# Patient Record
Sex: Female | Born: 1985 | Race: Black or African American | Hispanic: No | Smoking: Current every day smoker
Health system: Southern US, Community
[De-identification: ages and names within clinical notes are randomized; demographics above are authoritative.]

## PROBLEM LIST (undated history)

## (undated) HISTORY — PX: SKIN GRAFT: SHX250

## (undated) HISTORY — PX: TUBAL LIGATION: SHX77

---

## 2006-01-25 ENCOUNTER — Emergency Department: Payer: Self-pay | Admitting: Emergency Medicine

## 2006-06-25 ENCOUNTER — Inpatient Hospital Stay: Payer: Self-pay | Admitting: Obstetrics and Gynecology

## 2008-03-29 ENCOUNTER — Inpatient Hospital Stay: Payer: Self-pay

## 2008-05-07 ENCOUNTER — Emergency Department: Payer: Self-pay | Admitting: Emergency Medicine

## 2009-09-10 ENCOUNTER — Emergency Department: Payer: Self-pay | Admitting: Unknown Physician Specialty

## 2009-11-08 ENCOUNTER — Emergency Department: Payer: Self-pay | Admitting: Emergency Medicine

## 2010-05-23 ENCOUNTER — Observation Stay: Payer: Self-pay | Admitting: Emergency Medicine

## 2010-06-06 ENCOUNTER — Observation Stay: Payer: Self-pay | Admitting: Obstetrics and Gynecology

## 2010-06-16 ENCOUNTER — Observation Stay: Payer: Self-pay

## 2010-06-23 ENCOUNTER — Observation Stay: Payer: Self-pay | Admitting: Obstetrics and Gynecology

## 2010-06-26 ENCOUNTER — Observation Stay: Payer: Self-pay | Admitting: Obstetrics and Gynecology

## 2010-06-29 ENCOUNTER — Inpatient Hospital Stay: Payer: Self-pay

## 2013-06-23 ENCOUNTER — Observation Stay: Payer: Self-pay | Admitting: Emergency Medicine

## 2013-06-23 LAB — PIH PROFILE
Anion Gap: 6 — ABNORMAL LOW (ref 7–16)
BUN: 5 mg/dL — ABNORMAL LOW (ref 7–18)
Calcium, Total: 8.7 mg/dL (ref 8.5–10.1)
Chloride: 104 mmol/L (ref 98–107)
Co2: 26 mmol/L (ref 21–32)
Creatinine: 0.51 mg/dL — ABNORMAL LOW (ref 0.60–1.30)
EGFR (Non-African Amer.): >60
Glucose: 86 mg/dL (ref 65–99)
HCT: 32.7 % — AB (ref 35.0–47.0)
HGB: 11 g/dL — AB (ref 12.0–16.0)
MCH: 24 pg — AB (ref 26.0–34.0)
MCHC: 33.6 g/dL (ref 32.0–36.0)
MCV: 72 fL — AB (ref 80–100)
OSMOLALITY: 269 (ref 275–301)
PLATELETS: 353 10*3/uL (ref 150–440)
Potassium: 3.8 mmol/L (ref 3.5–5.1)
RBC: 4.57 10*6/uL (ref 3.80–5.20)
RDW: 15.5 % — ABNORMAL HIGH (ref 11.5–14.5)
SGOT(AST): 12 U/L — ABNORMAL LOW (ref 15–37)
SODIUM: 136 mmol/L (ref 136–145)
Uric Acid: 3.5 mg/dL (ref 2.6–6.0)
WBC: 12.9 10*3/uL — ABNORMAL HIGH (ref 3.6–11.0)

## 2013-06-23 LAB — DRUG SCREEN, URINE
Amphetamines, Ur Screen: NEGATIVE (ref ?–1000)
BARBITURATES, UR SCREEN: NEGATIVE (ref ?–200)
BENZODIAZEPINE, UR SCRN: NEGATIVE (ref ?–200)
Cannabinoid 50 Ng, Ur ~~LOC~~: NEGATIVE (ref ?–50)
Cocaine Metabolite,Ur ~~LOC~~: NEGATIVE (ref ?–300)
MDMA (ECSTASY) UR SCREEN: NEGATIVE (ref ?–500)
Methadone, Ur Screen: NEGATIVE (ref ?–300)
OPIATE, UR SCREEN: NEGATIVE (ref ?–300)
Phencyclidine (PCP) Ur S: NEGATIVE (ref ?–25)
Tricyclic, Ur Screen: NEGATIVE (ref ?–1000)

## 2013-06-23 LAB — PROTEIN / CREATININE RATIO, URINE
CREATININE, URINE: 50.6 mg/dL (ref 30.0–125.0)
PROTEIN/CREAT. RATIO: 257 mg/g{creat} — AB (ref 0–200)
Protein, Random Urine: 13 mg/dL — ABNORMAL HIGH (ref 0–12)

## 2013-06-23 LAB — URINALYSIS, COMPLETE
Bilirubin,UR: NEGATIVE
Blood: NEGATIVE
GLUCOSE, UR: NEGATIVE mg/dL (ref 0–75)
Ketone: NEGATIVE
NITRITE: POSITIVE
Ph: 7 (ref 4.5–8.0)
Protein: NEGATIVE
RBC,UR: <1 /HPF (ref 0–5)
Specific Gravity: 1.006 (ref 1.003–1.030)

## 2013-06-23 LAB — FETAL FIBRONECTIN
Appearance: NORMAL
FETAL FIBRONECTIN: NEGATIVE

## 2013-06-26 ENCOUNTER — Observation Stay: Payer: Self-pay

## 2013-07-03 ENCOUNTER — Inpatient Hospital Stay: Payer: Self-pay | Admitting: Obstetrics & Gynecology

## 2013-07-03 LAB — PIH PROFILE
Anion Gap: 7 (ref 7–16)
BUN: 4 mg/dL — AB (ref 7–18)
CALCIUM: 9.3 mg/dL (ref 8.5–10.1)
CO2: 23 mmol/L (ref 21–32)
Chloride: 103 mmol/L (ref 98–107)
Creatinine: 0.52 mg/dL — ABNORMAL LOW (ref 0.60–1.30)
EGFR (African American): >60
EGFR (Non-African Amer.): >60
GLUCOSE: 80 mg/dL (ref 65–99)
HCT: 32.5 % — ABNORMAL LOW (ref 35.0–47.0)
HGB: 10.7 g/dL — ABNORMAL LOW (ref 12.0–16.0)
MCH: 23.9 pg — ABNORMAL LOW (ref 26.0–34.0)
MCHC: 33 g/dL (ref 32.0–36.0)
MCV: 72 fL — ABNORMAL LOW (ref 80–100)
OSMOLALITY: 262 (ref 275–301)
POTASSIUM: 3.6 mmol/L (ref 3.5–5.1)
Platelet: 286 10*3/uL (ref 150–440)
RBC: 4.49 10*6/uL (ref 3.80–5.20)
RDW: 15.5 % — ABNORMAL HIGH (ref 11.5–14.5)
SGOT(AST): 16 U/L (ref 15–37)
SODIUM: 133 mmol/L — AB (ref 136–145)
Uric Acid: 3.4 mg/dL (ref 2.6–6.0)
WBC: 13.6 10*3/uL — AB (ref 3.6–11.0)

## 2013-07-03 LAB — FETAL FIBRONECTIN
APPEARANCE: NORMAL
Fetal Fibronectin: NEGATIVE

## 2013-07-03 LAB — PROTEIN / CREATININE RATIO, URINE
Creatinine, Urine: 36.9 mg/dL (ref 30.0–125.0)
PROTEIN, RANDOM URINE: 6 mg/dL (ref 0–12)
PROTEIN/CREAT. RATIO: 163 mg/g{creat} (ref 0–200)

## 2013-07-05 LAB — BETA STREP CULTURE(ARMC)

## 2013-07-13 ENCOUNTER — Encounter: Payer: Self-pay | Admitting: Obstetrics and Gynecology

## 2013-07-16 ENCOUNTER — Encounter: Payer: Self-pay | Admitting: Obstetrics and Gynecology

## 2013-07-22 ENCOUNTER — Inpatient Hospital Stay: Payer: Self-pay | Admitting: Obstetrics and Gynecology

## 2013-07-22 LAB — CBC WITH DIFFERENTIAL/PLATELET
BASOS ABS: 0 10*3/uL (ref 0.0–0.1)
Basophil %: 0.3 %
Eosinophil #: 0.1 10*3/uL (ref 0.0–0.7)
Eosinophil %: 0.8 %
HCT: 33.3 % — ABNORMAL LOW (ref 35.0–47.0)
HGB: 11.2 g/dL — AB (ref 12.0–16.0)
LYMPHS PCT: 14.3 %
Lymphocyte #: 1.9 10*3/uL (ref 1.0–3.6)
MCH: 24.3 pg — AB (ref 26.0–34.0)
MCHC: 33.5 g/dL (ref 32.0–36.0)
MCV: 73 fL — ABNORMAL LOW (ref 80–100)
Monocyte #: 0.7 x10 3/mm (ref 0.2–0.9)
Monocyte %: 5.1 %
NEUTROS ABS: 10.9 10*3/uL — AB (ref 1.4–6.5)
NEUTROS PCT: 79.5 %
Platelet: 246 10*3/uL (ref 150–440)
RBC: 4.6 10*6/uL (ref 3.80–5.20)
RDW: 16 % — AB (ref 11.5–14.5)
WBC: 13.7 10*3/uL — AB (ref 3.6–11.0)

## 2013-07-23 LAB — HEMATOCRIT: HCT: 30.6 % — ABNORMAL LOW (ref 35.0–47.0)

## 2013-07-25 LAB — PATHOLOGY REPORT

## 2013-08-09 ENCOUNTER — Encounter
Admit: 2013-08-09 | Disposition: A | Discharge status: Home / Self Care | Payer: Self-pay | Admitting: Maternal and Fetal Medicine

## 2014-09-13 ENCOUNTER — Emergency Department
Admit: 2014-09-13 | Disposition: A | Discharge status: Home / Self Care | Payer: Self-pay | Admitting: Emergency Medicine

## 2014-09-13 LAB — BASIC METABOLIC PANEL
Anion Gap: 6 — ABNORMAL LOW (ref 7–16)
BUN: 7 mg/dL
CHLORIDE: 101 mmol/L
CREATININE: 0.63 mg/dL
Calcium, Total: 8.5 mg/dL — ABNORMAL LOW
Co2: 25 mmol/L
EGFR (African American): >60
EGFR (Non-African Amer.): >60
Glucose: 125 mg/dL — ABNORMAL HIGH
Potassium: 3.3 mmol/L — ABNORMAL LOW
Sodium: 132 mmol/L — ABNORMAL LOW

## 2014-09-13 LAB — CBC
HCT: 37.3 % (ref 35.0–47.0)
HGB: 11.7 g/dL — ABNORMAL LOW (ref 12.0–16.0)
MCH: 20.9 pg — ABNORMAL LOW (ref 26.0–34.0)
MCHC: 31.4 g/dL — ABNORMAL LOW (ref 32.0–36.0)
MCV: 67 fL — ABNORMAL LOW (ref 80–100)
Platelet: 369 10*3/uL (ref 150–440)
RBC: 5.59 10*6/uL — ABNORMAL HIGH (ref 3.80–5.20)
RDW: 16.7 % — AB (ref 11.5–14.5)
WBC: 9.1 10*3/uL (ref 3.6–11.0)

## 2014-09-13 LAB — URINALYSIS, COMPLETE
BILIRUBIN, UR: NEGATIVE
GLUCOSE, UR: NEGATIVE mg/dL (ref 0–75)
Ketone: NEGATIVE
NITRITE: NEGATIVE
Ph: 5 (ref 4.5–8.0)
Protein: NEGATIVE
RBC,UR: 20 /HPF (ref 0–5)
Specific Gravity: 1.019 (ref 1.003–1.030)
Squamous Epithelial: 4

## 2014-09-13 LAB — TROPONIN I: Troponin-I: <0.03 ng/mL

## 2014-09-13 LAB — LIPASE, BLOOD: Lipase: 49 U/L

## 2014-10-06 NOTE — Op Note (Signed)
PATIENT NAME:  Chelsey Mendoza, Chelsey Mendoza MR#:  [id number] DATE OF BIRTH:  [birth year]  DATE OF PROCEDURE:  07/22/2013  PREOPERATIVE DIAGNOSES:  1. Dichorionic-diamniotic twin intrauterine pregnancy in the vertex breech presentation at 33 weeks 0 days' gestation.  2. Preterm labor.   POSTOPERATIVE DIAGNOSES:  1. Dichorionic-diamniotic twin intrauterine pregnancy in the vertex breech presentation at 33 weeks 0 days' gestation.  2. Preterm labor.  3. Both fetuses noted to be in the breech presentation.   OPERATION PERFORMED: Primary low transverse cesarean section and bilateral tubal ligation via Pomeroy method.   ANESTHESIA USED: Spinal.   PRIMARY SURGEON: Florina OuAndreas M. Bonney AidStaebler, MD  ASSISTANT: Marta Lamasamara K. Brothers, CNM  DRAINS OR TUBES: Foley to gravity drainage, On-Q catheter system.   IMPLANTS: None.   ESTIMATED BLOOD LOSS: 1 liter.  OPERATIVE FLUIDS: 1800 mL of crystalloid.   URINE OUTPUT: 100 mL of clear urine.   COMPLICATIONS: None.   SPECIMENS REMOVED: Portion of right and left tube.   INTRAOPERATIVE FINDINGS: Normal uterus and ovaries. The left tube was adhesed to the uterine serosa and had to be taken down with the Bovie. The mesosalpinx had to be oversewn bilaterally because of oozing. A single layer closure was performed on the hysterotomy. The delivery resulted in the birth of a liveborn female infant for baby A weighing 1830 grams, Apgars 7 and 8. Baby B was also a female infant weighing 1630 grams, Apgars 8 and 9.   PATIENT CONDITION FOLLOWING PROCEDURE: Stable.   PROCEDURE IN DETAIL: Risks, benefits and alternatives of the procedure were discussed with the patient prior to proceeding to the operating room. Given the fetal weight and presentation, recommendation was made to proceed with primary low transverse C-section for delivery, which the patient was in agreement with. The patient was taken back to the operating room and placed under spinal anesthesia. She was prepped and draped in  the usual sterile fashion and positioned in the supine position. A timeout procedure was performed prior to proceeding with the case. The level of anesthetic was checked, and a Pfannenstiel skin incision was made 2 cm above the pubic symphysis, carried down sharply to the level of the rectus fascia. The fascia was incised in the midline using the knife, and the fascial incision was extended using Mayo scissors. The superior border of the rectus fascia was grasped 2 Kocher clamps, and the underlying rectus muscles were dissected off the fascia using blunt dissection and median raphe incised using Mayo scissors. The inferior border of the rectus fascia was dissected off the rectus muscle in a similar fashion. The midline was identified. The peritoneum was entered bluntly, and the peritoneal incision was extended using manual traction. A bladder blade was placed. A bladder flap was then created using Metzenbaum scissors and further developed digitally before replacing the bladder blade to displace the bladder caudally. A low transverse incision was made on the uterus using the scalpel. The hysterotomy was entered bluntly using the operator's finger. The hysterotomy incision was extended using manual traction. Baby A was noted to be in the frank breech position. The breech was grasped, brought to the incision and delivered to the level of the scapula. The left arm was swept across the infant's chest, splinting the arm. The infant was then rotated 180 degrees. The right arm was delivered in a similar fashion. Mauriceau-Smellie-Veit maneuver was then used to flex the fetal head, and delivery resulted with ease. The infant was suctioned, cord was clamped and cut, and the infant was passed  to the awaiting pediatrician. Baby B was also noted to be in the frank breech position. The breech was grasped, brought to the incision, delivered atraumatically and delivered using the same maneuvers as with baby A. After delivery of the  infants, cord blood was obtained. The placenta was delivered using manual extraction. The uterus was exteriorized, wiped clean of clots and debris using 2 moist laps. Bladder blade was replaced, and the hysterotomy incision was closed using a single layer closure of 0 Vicryl in a running locked fashion. Attention was then turned to the patient's right tube. A segment of tube was doubly ligated using a 0 chromic wheel using a Pomeroy method. The knuckle of tube was then excised using Metzenbaum scissors. Bilateral tubal ostia were visualized. The left tube was noted to be adhesed to the uterine serosa and had to be taken down using a Bovie. The tube was then excised using a similar Pomeroy method as on the right. Upon attempting to replace the uterus in the abdomen, the sutures were noted to have slipped. The mesosalpinx was therefore oversewn using a 2-0 Vicryl in a running locked fashion. The uterus was then returned to the abdomen. The hysterotomy incision was reinspected and noted to be hemostatic, as were both tubes. The On-Q catheter system was placed subfascially per the usual protocol. The fascia was closed using a looped #1 PDS in a running fashion. Subcutaneous tissue was irrigated. Hemostasis was achieved using the Bovie. The skin was closed using Insorb staples. Each On-Q catheter was bolused with 5 mL of 0.5% bupivacaine each. Sponge, needle and instrument counts were correct x2. The patient tolerated the procedure well and was taken to the recovery room in stable condition.    ____________________________ Florina Ou. Bonney Aid, MD ams:lb D: 07/28/2013 11:22:55 ET T: 07/28/2013 11:51:13 ET JOB#: [id number]  cc: Florina Ou. Bonney Aid, MD, <Dictator> [place] Cathrine Muster MD ELECTRONICALLY SIGNED 08/02/2013 1:00
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
## 2014-10-23 NOTE — H&P (Signed)
L&D Evaluation:  History Expanded:  HPI 29 yo G6 P4105 EDD of 09/09/13, [facility]. Presents at 33 wks with c/o cramping that began around 1100 this am, becoming more frequent. Pregnancy notable for Di/Di twins, late onset of care at 25 wks, h/o growth discrepancy with twin B 8% at 28 wks (most recent growth scan on 1/29: twin A 43%, twin B 31% - only 4.3% discordance), previous admission for PTL at 30 wks - received BMZ and Magnesium Sulfate. She desires PP BTL, consent signed 07/10/13.   Blood Type (Maternal) O positive   Group B Strep Results Maternal (Result >5wks must be treated as unknown) negative  from 07/03/13   Maternal HIV Negative   Maternal Syphilis Ab Nonreactive   Maternal Varicella Non-Immune   Rubella Results (Maternal) nonimmune   Presents with contractions   Patient's Medical History No Chronic Illness   Patient's Surgical History skin graft   Medications Pre Natal Vitamins   Allergies NKDA   Social History none   Current Prenatal Course Notable For Multiple Gestation  PreTerm Labor   Exam:  Vital Signs stable   General no apparent distress   Mental Status clear   Chest clear   Heart no murmur/gallop/rubs   Abdomen gravid, tender with contractions   Fetal Position twin A breech per most recent US   Edema no edema   Pelvic 4/50/-3   Mebranes Intact   FHT normal rate with no decels, category 1 tracing for both fetuses   Ucx regular, mild, q 2-10 min   Impression:  Impression twin IUP at 33 wks, PTL   Plan:  Comments IV fluids UA/Urine culture Betamethasone given at previous admission Dr Bonney AidStaebler consulted. Will plan to recheck cervix in approximately 1 hour.   Plan as of 1/29 for C-section and BTL given non-vertex presentation of twin A   Electronic Signatures: Vella KohlerBrothers, Jeaninne Lodico K (CNM)  (Signed 07-Feb-15 14:30)  Authored: L&D Evaluation   Last Updated: 07-Feb-15 14:30 by Vella KohlerBrothers, Kaylianna Detert K (CNM)

## 2014-10-23 NOTE — H&P (Signed)
L&D Evaluation:  History:  HPI 29 yo [id number] with di/di twin pregnancy A: 1167g c/w 21st %ile and B: 106g c/w 8%ile on scan 06/21/13 with mildly elevated [facility]pp[facility] which [facility] the [place] which assigned dating on ths pregnancy with EDC of 09/09/13, 8377w2d today.   Patient presents today for NST. She reports that she has been having contractions for a while but this morning they started getting more uncomfortable. She also reports that she was called this am by the office and was told she has a UTI with a prescription sent in to the pharmacy. Her last visit on L&D was on 1/9 for decreased fetal movement and bleeding. Her FFN was negative at that time, she was having no contractions and her cervix was closed. She was also assessed for pre-eclampsia due to increasedc blood pressures.  Her prenatal labs are O+, RNI, VNI, Hep B neg, HIV neg.   Presents with decreased fetal movement, vaginal bleeding   Patient's Surgical History none   Medications Pre Natal Vitamins   Allergies NKDA   Social History none   Family History Non-Contributory   ROS:  ROS All systems were reviewed.  HEENT, CNS, GI, GU, Respiratory, CV, Renal and Musculoskeletal systems were found to be normal.   Exam:  Vital Signs some elevated blood pressures 130-140's/80's   General no apparent distress   Mental Status clear   Chest clear   Heart normal sinus rhythm   Abdomen gravid, non-tender   Pelvic other, 2/60/-3   Mebranes Intact   FHT normal rate with no decels, appropriate for gestational age   Ucx regular, 2-4 minutes. Has now spaced out to 3-6 minutes   Skin dry, no lesions, no rashes   Lymph no lymphadenopathy   Impression:  Impression reactive NST, IUP of di-di twins at 30.2, R/O PTL, UTI   Plan:  Plan EFM/NST, monitor contractions and for cervical change, monitor BP, PIH panel, FFN sent, possible steroids for fetal lung maturity, will consult with MD   Follow Up Appointment already  scheduled. on Thursday with DPN   Electronic Signatures: Jannet MantisSubudhi, Marykate Heuberger (CNM)  (Signed 19-Jan-15 11:13)  Authored: L&D Evaluation   Last Updated: 19-Jan-15 11:13 by Jannet MantisSubudhi, Jarelly Rinck (CNM)

## 2015-01-31 ENCOUNTER — Emergency Department
Admission: EM | Admit: 2015-01-31 | Discharge: 2015-01-31 | Disposition: A | Discharge status: Home / Self Care | Payer: Medicaid Other | Attending: Emergency Medicine | Admitting: Emergency Medicine

## 2015-01-31 DIAGNOSIS — Y9289 Other specified places as the place of occurrence of the external cause: Secondary | ICD-10-CM | POA: Insufficient documentation

## 2015-01-31 DIAGNOSIS — W57XXXA Bitten or stung by nonvenomous insect and other nonvenomous arthropods, initial encounter: Secondary | ICD-10-CM | POA: Diagnosis not present

## 2015-01-31 DIAGNOSIS — Y9389 Activity, other specified: Secondary | ICD-10-CM | POA: Diagnosis not present

## 2015-01-31 DIAGNOSIS — S80861A Insect bite (nonvenomous), right lower leg, initial encounter: Secondary | ICD-10-CM | POA: Diagnosis not present

## 2015-01-31 DIAGNOSIS — Y998 Other external cause status: Secondary | ICD-10-CM | POA: Insufficient documentation

## 2015-01-31 MED ORDER — BACITRACIN-NEOMYCIN-POLYMYXIN 400-5-5000 EX OINT
TOPICAL_OINTMENT | Freq: Every day | CUTANEOUS | Status: DC
  Administered 2015-01-31: 1 via TOPICAL
  Filled 2015-01-31: qty 1

## 2015-01-31 NOTE — ED Notes (Signed)
Patient with report of insect bite of unknown time frame to right outer thigh, reports waking this morning with increasing pain to that area and some numbness around.

## 2015-01-31 NOTE — Discharge Instructions (Signed)
Insect Bite Mosquitoes, flies, fleas, bedbugs, and many other insects can bite. Insect bites are different from insect stings. A sting is when venom is injected into the skin. Some insect bites can transmit infectious diseases. SYMPTOMS  Insect bites usually turn red, swell, and itch for 2 to 4 days. They often go away on their own. TREATMENT  Your caregiver may prescribe antibiotic medicines if a bacterial infection develops in the bite. HOME CARE INSTRUCTIONS  Do not scratch the bite area.  Keep the bite area clean and dry. Wash the bite area thoroughly with soap and water.  Put ice or cool compresses on the bite area.  Put ice in a plastic bag.  Place a towel between your skin and the bag.  Leave the ice on for 20 minutes, 4 times a day for the first 2 to 3 days, or as directed.  You may apply a baking soda paste, cortisone cream, or calamine lotion to the bite area as directed by your caregiver. This can help reduce itching and swelling.  Only take over-the-counter or prescription medicines as directed by your caregiver.  If you are given antibiotics, take them as directed. Finish them even if you start to feel better. You may need a tetanus shot if:  You cannot remember when you had your last tetanus shot.  You have never had a tetanus shot.  The injury broke your skin. If you get a tetanus shot, your arm may swell, get red, and feel warm to the touch. This is common and not a problem. If you need a tetanus shot and you choose not to have one, there is a rare chance of getting tetanus. Sickness from tetanus can be serious. SEEK IMMEDIATE MEDICAL CARE IF:   You have increased pain, redness, or swelling in the bite area.  You see a red line on the skin coming from the bite.  You have a fever.  You have joint pain.  You have a headache or neck pain.  You have unusual weakness.  You have a rash.  You have chest pain or shortness of breath.  You have abdominal pain,  nausea, or vomiting.  You feel unusually tired or sleepy. MAKE SURE YOU:   Understand these instructions.  Will watch your condition.  Will get help right away if you are not doing well or get worse. Document Released: 07/09/2004 Document Revised: 08/24/2011 Document Reviewed: 12/31/2010 [facility] Patient Information 2015 [place], [place]. This information is not intended to replace advice given to you by your health care provider. Make sure you discuss any questions you have with your health care provider.  Keep the wound clean, dry, and covered.  Apply antibiotic ointment as needed.

## 2015-01-31 NOTE — ED Provider Notes (Signed)
[facility] Emergency Department Provider Note ____________________________________________  Time seen: 1310  I have reviewed the triage vital signs and the nursing notes.  HISTORY  Chief Complaint  Insect Bite  HPI Chelsey Mendoza is a 29 y.o. female reports to the ED for evaluation and management of an insect bite to the right lower leg. She describes recently returned from out-of-town trip, and then when she got out of the shower she noted the area to the right lower leg. She does admit describing the area vigorously while in the shower, thinking that there was something in it. Since that time she's had some increased local redness and tenderness to the area. She denies any fevers, chills, sweats, nausea, vomiting, or dizziness.  No past medical history on file.  There are no active problems to display for this patient.  No past surgical history on file.  No current outpatient prescriptions on file.  Allergies Review of patient's allergies indicates not on file.  No family history on file.  Social History Social History  Substance Use Topics  . Smoking status: Not on file  . Smokeless tobacco: Not on file  . Alcohol Use: Not on file   Review of Systems  Constitutional: Negative for fever. Eyes: Negative for visual changes. ENT: Negative for sore throat. Cardiovascular: Negative for chest pain. Respiratory: Negative for shortness of breath. Gastrointestinal: Negative for abdominal pain, vomiting and diarrhea. Genitourinary: Negative for dysuria. Musculoskeletal: Negative for back pain. Skin: Negative for rash. Insect bite as above Neurological: Negative for headaches, focal weakness or numbness. ____________________________________________  PHYSICAL EXAM:  VITAL SIGNS: ED Triage Vitals  Enc Vitals Group     BP 01/31/15 1206 151/93 mmHg     Pulse Rate 01/31/15 1206 112     Resp 01/31/15 1206 16     Temp 01/31/15 1206 98.2 F (36.8 C)   Temp Source 01/31/15 1206 Oral     SpO2 01/31/15 1206 97 %     Weight 01/31/15 1206 195 lb (88.451 kg)     Height 01/31/15 1206 5\' 6"  (1.676 m)     Head Cir --      Peak Flow --      Pain Score 01/31/15 1207 5     Pain Loc --      Pain Edu? --      Excl. in GC? --    Constitutional: Alert and oriented. Well appearing and in no distress. Eyes: Conjunctivae are normal. PERRL. Normal extraocular movements. ENT   Head: Normocephalic and atraumatic.   Nose: No congestion/rhinnorhea.   Mouth/Throat: Mucous membranes are moist.   Neck: Supple. No thyromegaly. Hematological/Lymphatic/Immunilogical: No cervical lymphadenopathy. Cardiovascular: Normal rate, regular rhythm.  Respiratory: Normal respiratory effort. No wheezes/rales/rhonchi. Gastrointestinal: Soft and nontender. No distention. Musculoskeletal: Nontender with normal range of motion in all extremities.  Neurologic:  Normal gait without ataxia. Normal speech and language. No gross focal neurologic deficits are appreciated. Skin:  Skin is warm, dry and intact. No rash noted. Right lateral calf with a small, abraded area with local erythema. No active drainage noted.   Psychiatric: Mood and affect are normal. Patient exhibits appropriate insight and judgment. ____________________________________________  PROCEDURES  Dressing with antibiotic ointment  ____________________________________________  INITIAL IMPRESSION / ASSESSMENT AND PLAN / ED COURSE  Local insect bite reaction. No indication of cellulitis or abscess formation. Keep the area clean and covered as needed.  ____________________________________________  FINAL CLINICAL IMPRESSION(S) / ED DIAGNOSES  Final diagnoses:  Insect bite  Charlesetta Ivory Baldwin, PA-C 01/31/15 1351  Jene Every, MD 01/31/15 [phone]

## 2015-04-28 ENCOUNTER — Emergency Department
Admission: EM | Admit: 2015-04-28 | Discharge: 2015-04-28 | Disposition: A | Discharge status: Home / Self Care | Payer: Medicaid Other | Attending: Emergency Medicine | Admitting: Emergency Medicine

## 2015-04-28 DIAGNOSIS — R51 Headache: Secondary | ICD-10-CM | POA: Diagnosis present

## 2015-04-28 DIAGNOSIS — J320 Chronic maxillary sinusitis: Secondary | ICD-10-CM

## 2015-04-28 DIAGNOSIS — J0100 Acute maxillary sinusitis, unspecified: Secondary | ICD-10-CM | POA: Insufficient documentation

## 2015-04-28 MED ORDER — PREDNISONE 50 MG PO TABS: ORAL_TABLET | ORAL | Status: DC

## 2015-04-28 MED ORDER — OXYMETAZOLINE HCL 0.05 % NA SOLN
1.0000 | Freq: Once | NASAL | Status: AC
  Administered 2015-04-28: 1 via NASAL
  Filled 2015-04-28 (×2): qty 15

## 2015-04-28 MED ORDER — AMOXICILLIN-POT CLAVULANATE 875-125 MG PO TABS: 1.0000 | ORAL_TABLET | Freq: Two times a day (BID) | ORAL | Status: AC

## 2015-04-28 MED ORDER — OXYMETAZOLINE HCL 0.05 % NA SOLN: 2.0000 | Freq: Two times a day (BID) | NASAL | Status: DC

## 2015-04-28 NOTE — ED Notes (Signed)
Pt informed to return if any life threatening symptoms occur.  

## 2015-04-28 NOTE — ED Notes (Signed)
Pt states intermittent HA X2 weeks that begin in nasal area and moves upward. Pt reports bloody nose last night, thick discharge from nose when blown. Pt comes from work to be evaluated. Pt alert and oriented X4, active, cooperative, pt in NAD. RR even and unlabored, color WNL.

## 2015-04-28 NOTE — Discharge Instructions (Signed)

## 2015-04-28 NOTE — ED Provider Notes (Signed)
[facility] Emergency Department Provider Note     Time seen: ----------------------------------------- 2:42 PM on 04/28/2015 -----------------------------------------    I have reviewed the triage vital signs and the nursing notes.   HISTORY  Chief Complaint Headache    HPI Chelsey Mendoza is a 29 y.o. female who presents ER for intermittent headache for 2 weeks that began the right nasal area and moves upward to her forehead. Patient reports bloody nasal discharge last night with thick discharge from the nose when blown. Patient comes from work to be evaluated, denies any fevers chills but does also complain of tooth pain on the right upper jaw.   History reviewed. No pertinent past medical history.  There are no active problems to display for this patient.   History reviewed. No pertinent past surgical history.  Allergies Review of patient's allergies indicates no known allergies.  Social History Social History  Substance Use Topics  . Smoking status: Never Smoker   . Smokeless tobacco: None  . Alcohol Use: No    Review of Systems Constitutional: Negative for fever. Eyes: Negative for visual changes. ENT: Negative for sore throat. Cardiovascular: Negative for chest pain. Respiratory: Negative for shortness of breath. Gastrointestinal: Negative for abdominal pain, vomiting and diarrhea. Genitourinary: Negative for dysuria. Musculoskeletal: Negative for back pain. Skin: Negative for rash. Neurological: Positive for right frontal headache and facial pain  10-point ROS otherwise negative.  ____________________________________________   PHYSICAL EXAM:  VITAL SIGNS: ED Triage Vitals  Enc Vitals Group     BP 04/28/15 1311 151/89 mmHg     Pulse Rate 04/28/15 1311 104     Resp 04/28/15 1311 18     Temp 04/28/15 1311 98.2 F (36.8 C)     Temp Source 04/28/15 1311 Oral     SpO2 04/28/15 1311 98 %     Weight 04/28/15 1311 191 lb  (86.637 kg)     Height 04/28/15 1311  (1.676 m)     Head Cir --      Peak Flow --      Pain Score 04/28/15 1311 10     Pain Loc --      Pain Edu? --      Excl. in GC? --     Constitutional: Alert and oriented. Well appearing and in no distress. Eyes: Conjunctivae are normal. PERRL. Normal extraocular movements. ENT   Head: Normocephalic and atraumatic. There is tenderness of the right frontal and right maxillary sinus.   Nose: Complete opacification of the right nasal passage with mucosal edema and purulent discharge. There is edema and discharge in the left as well.   Mouth/Throat: Mucous membranes are moist.   Neck: No stridor. Cardiovascular: Normal rate, regular rhythm. Normal and symmetric distal pulses are present in all extremities. No murmurs, rubs, or gallops. Respiratory: Normal respiratory effort without tachypnea nor retractions. Breath sounds are clear and equal bilaterally. No wheezes/rales/rhonchi. Gastrointestinal: Soft and nontender. No distention. No abdominal bruits.  Musculoskeletal: Nontender with normal range of motion in all extremities. No joint effusions.  No lower extremity tenderness nor edema. Neurologic:  Normal speech and language. No gross focal neurologic deficits are appreciated. Speech is normal. No gait instability. Skin:  Skin is warm, dry and intact. No rash noted. Psychiatric: Mood and affect are normal. Speech and behavior are normal. Patient exhibits appropriate insight and judgment. ____________________________________________  ED COURSE:  Pertinent labs & imaging results that were available during my care of the patient were reviewed by me  and considered in my medical decision making (see chart for details). Patient is in no acute distress, clinically has significant sinus infection. Patient states symptoms; on for 2 months on and off and this would likely require  antibiotics. ____________________________________________  FINAL ASSESSMENT AND PLAN  Right maxillary sinusitis  Plan: Patient with labs and imaging as dictated above. Patient was given dose of Afrin here, she'll be discharged with Augmentin, steroids and Afrin nasal spray. She is advised to follow-up with ENT in a week if not improving.   Emily FilbertWilliams, Jonathan E, MD   Emily FilbertJonathan E Williams, MD 04/28/15 [phone]

## 2015-08-31 ENCOUNTER — Emergency Department
Admission: EM | Admit: 2015-08-31 | Discharge: 2015-08-31 | Disposition: A | Discharge status: Home / Self Care | Payer: Medicaid Other | Attending: Emergency Medicine | Admitting: Emergency Medicine

## 2015-08-31 ENCOUNTER — Emergency Department: Payer: Medicaid Other

## 2015-08-31 ENCOUNTER — Encounter: Payer: Self-pay | Admitting: Emergency Medicine

## 2015-08-31 DIAGNOSIS — M25562 Pain in left knee: Secondary | ICD-10-CM | POA: Insufficient documentation

## 2015-08-31 MED ORDER — NAPROXEN 500 MG PO TABS: 500.0000 mg | ORAL_TABLET | Freq: Two times a day (BID) | ORAL | Status: AC

## 2015-08-31 NOTE — Discharge Instructions (Signed)
Advise elastic knee support wire at work.

## 2015-08-31 NOTE — ED Notes (Signed)
Pt c/o left knee pain X 2 months.  Pain worst when bending knee up.  Denies swelling or fevers.  Does not remember injury.

## 2015-08-31 NOTE — ED Provider Notes (Signed)
[facility] Emergency Department Provider Note  ____________________________________________  Time seen: Approximately 3:24 PM  I have reviewed the triage vital signs and the nursing notes.   HISTORY  Chief Complaint Knee Pain    HPI Chelsey Mendoza is a 30 y.o. female patient complaining the left knee pain for 2 months. Patient state pain increase with flexion. Patient denies any injury. Patient denies any problem with the opposite knee. Patient describes the pain as "achy". She stated today she had to lift her leg to get into the car to drive home. Patient rates the pain as a 6/10. No palliative measures taken for this complaint.   History reviewed. No pertinent past medical history.  There are no active problems to display for this patient.   History reviewed. No pertinent past surgical history.  Current Outpatient Rx  Name  Route  Sig  Dispense  Refill  . naproxen (NAPROSYN) 500 MG tablet   Oral   Take 1 tablet (500 mg total) by mouth 2 (two) times daily with a meal.   60 tablet   2   . oxymetazoline (AFRIN) 0.05 % nasal spray   Each Nare   Place 2 sprays into both nostrils 2 (two) times daily.   15 mL   2   . predniSONE (DELTASONE) 50 MG tablet      One tablet by mouth daily   5 tablet   0     Allergies Review of patient's allergies indicates no known allergies.  History reviewed. No pertinent family history.  Social History Social History  Substance Use Topics  . Smoking status: Never Smoker   . Smokeless tobacco: None  . Alcohol Use: No    Review of Systems Constitutional: No fever/chills Eyes: No visual changes. ENT: No sore throat. Cardiovascular: Denies chest pain. Respiratory: Denies shortness of breath. Gastrointestinal: No abdominal pain.  No nausea, no vomiting.  No diarrhea.  No constipation. Genitourinary: Negative for dysuria. Musculoskeletal: Anterior inferior knee pain Skin: Negative for rash. Neurological:  Negative for headaches, focal weakness or numbness.    ____________________________________________   PHYSICAL EXAM:  VITAL SIGNS: ED Triage Vitals  Enc Vitals Group     BP 08/31/15 1446 139/85 mmHg     Pulse --      Resp 08/31/15 1446 18     Temp 08/31/15 1446 89 F (31.7 C)     Temp Source 08/31/15 1446 Oral     SpO2 08/31/15 1446 98 %     Weight 08/31/15 1446 193 lb (87.544 kg)     Height 08/31/15 1446  (1.676 m)     Head Cir --      Peak Flow --      Pain Score 08/31/15 1446 6     Pain Loc --      Pain Edu? --      Excl. in GC? --     Constitutional: Alert and oriented. Well appearing and in no acute distress. Eyes: Conjunctivae are normal. PERRL. EOMI. Head: Atraumatic. Nose: No congestion/rhinnorhea. Mouth/Throat: Mucous membranes are moist.  Oropharynx non-erythematous. Neck: No stridor.  No cervical spine tenderness to palpation. Hematological/Lymphatic/Immunilogical: No cervical lymphadenopathy. Cardiovascular: Normal rate, regular rhythm. Grossly normal heart sounds.  Good peripheral circulation. Respiratory: Normal respiratory effort.  No retractions. Lungs CTAB. Gastrointestinal: Soft and nontender. No distention. No abdominal bruits. No CVA tenderness. Musculoskeletal: No lower extremity tenderness nor edema.  Mild crepitus anterior patella left knee Neurologic:  Normal speech and language. No gross  focal neurologic deficits are appreciated. No gait instability. Skin:  Skin is warm, dry and intact. No rash noted. Psychiatric: Mood and affect are normal. Speech and behavior are normal.  ____________________________________________   LABS (all labs ordered are listed, but only abnormal results are displayed)  Labs Reviewed - No data to display ____________________________________________  EKG   ____________________________________________  RADIOLOGY  No acute findings. I, Joni Reiningonald K Lataja Newland, personally viewed and evaluated these images (plain  radiographs) as part of my medical decision making, as well as reviewing the written report by the radiologist.  ____________________________________________   PROCEDURES  Procedure(s) performed: None  Critical Care performed: No  ____________________________________________   INITIAL IMPRESSION / ASSESSMENT AND PLAN / ED COURSE  Pertinent labs & imaging results that were available during my care of the patient were reviewed by me and considered in my medical decision making (see chart for details).  Arthralgia left knee. Discussed x-ray finding with patient. Patient given discharge Instructions. Patient advised to consider using elastic knee support wire at work. Patient given a prescription for naproxen. Ad[facility]. ____________________________________________   FINAL CLINICAL IMPRESSION(S) / ED DIAGNOSES  Final diagnoses:  Arthralgia of left knee      Joni ReiningRonald K Nalu [place], PA-C 08/31/15 1635  Sharyn CreamerMark Quale, MD 08/31/15 2230

## 2015-12-10 ENCOUNTER — Emergency Department
Admission: EM | Admit: 2015-12-10 | Discharge: 2015-12-10 | Disposition: A | Discharge status: Home / Self Care | Payer: Medicaid Other | Attending: Emergency Medicine | Admitting: Emergency Medicine

## 2015-12-10 ENCOUNTER — Emergency Department: Payer: Medicaid Other

## 2015-12-10 ENCOUNTER — Other Ambulatory Visit: Payer: Self-pay

## 2015-12-10 DIAGNOSIS — Z7952 Long term (current) use of systemic steroids: Secondary | ICD-10-CM | POA: Insufficient documentation

## 2015-12-10 DIAGNOSIS — Z791 Long term (current) use of non-steroidal anti-inflammatories (NSAID): Secondary | ICD-10-CM | POA: Diagnosis not present

## 2015-12-10 DIAGNOSIS — J189 Pneumonia, unspecified organism: Secondary | ICD-10-CM | POA: Insufficient documentation

## 2015-12-10 DIAGNOSIS — R0602 Shortness of breath: Secondary | ICD-10-CM | POA: Diagnosis present

## 2015-12-10 LAB — TROPONIN I

## 2015-12-10 LAB — CBC WITH DIFFERENTIAL/PLATELET
Basophils Absolute: 0.2 10*3/uL — ABNORMAL HIGH (ref 0–0.1)
Basophils Relative: 1 %
EOS ABS: 0.3 10*3/uL (ref 0–0.7)
HCT: 32.8 % — ABNORMAL LOW (ref 35.0–47.0)
Hemoglobin: 10.9 g/dL — ABNORMAL LOW (ref 12.0–16.0)
LYMPHS ABS: 2.9 10*3/uL (ref 1.0–3.6)
Lymphocytes Relative: 18 %
MCH: 21.6 pg — AB (ref 26.0–34.0)
MCHC: 33.4 g/dL (ref 32.0–36.0)
MCV: 64.7 fL — AB (ref 80.0–100.0)
MONO ABS: 1.2 10*3/uL — AB (ref 0.2–0.9)
Neutro Abs: 11.7 10*3/uL — ABNORMAL HIGH (ref 1.4–6.5)
Neutrophils Relative %: 72 %
PLATELETS: 346 10*3/uL (ref 150–440)
RBC: 5.07 MIL/uL (ref 3.80–5.20)
RDW: 17.5 % — AB (ref 11.5–14.5)
WBC: 16.2 10*3/uL — AB (ref 3.6–11.0)

## 2015-12-10 LAB — COMPREHENSIVE METABOLIC PANEL
ALT: 15 U/L (ref 14–54)
AST: 15 U/L (ref 15–41)
Albumin: 4 g/dL (ref 3.5–5.0)
Alkaline Phosphatase: 86 U/L (ref 38–126)
Anion gap: 7 (ref 5–15)
BILIRUBIN TOTAL: 0.4 mg/dL (ref 0.3–1.2)
BUN: 11 mg/dL (ref 6–20)
CHLORIDE: 104 mmol/L (ref 101–111)
CO2: 25 mmol/L (ref 22–32)
Calcium: 8.9 mg/dL (ref 8.9–10.3)
Creatinine, Ser: 0.73 mg/dL (ref 0.44–1.00)
Glucose, Bld: 100 mg/dL — ABNORMAL HIGH (ref 65–99)
POTASSIUM: 3.9 mmol/L (ref 3.5–5.1)
Sodium: 136 mmol/L (ref 135–145)
TOTAL PROTEIN: 7.7 g/dL (ref 6.5–8.1)

## 2015-12-10 MED ORDER — DEXTROSE 5 % IV SOLN
1.0000 g | INTRAVENOUS | Status: DC
  Administered 2015-12-10: 1 g via INTRAVENOUS
  Filled 2015-12-10: qty 10

## 2015-12-10 MED ORDER — HYDROCOD POLST-CPM POLST ER 10-8 MG/5ML PO SUER: 5.0000 mL | Freq: Two times a day (BID) | ORAL | Status: DC

## 2015-12-10 MED ORDER — AMOXICILLIN 500 MG PO CAPS: 500.0000 mg | ORAL_CAPSULE | Freq: Three times a day (TID) | ORAL | Status: DC

## 2015-12-10 MED ORDER — IOPAMIDOL (ISOVUE-370) INJECTION 76%
75.0000 mL | Freq: Once | INTRAVENOUS | Status: AC | PRN
  Administered 2015-12-10: 75 mL via INTRAVENOUS

## 2015-12-10 MED ORDER — AZITHROMYCIN 1 G PO PACK
PACK | ORAL | Status: AC
  Filled 2015-12-10: qty 1

## 2015-12-10 MED ORDER — HYDROCOD POLST-CPM POLST ER 10-8 MG/5ML PO SUER
5.0000 mL | Freq: Once | ORAL | Status: AC
  Administered 2015-12-10: 5 mL via ORAL
  Filled 2015-12-10: qty 5

## 2015-12-10 MED ORDER — AZITHROMYCIN 250 MG PO TABS: 250.0000 mg | ORAL_TABLET | Freq: Every day | ORAL | Status: DC

## 2015-12-10 MED ORDER — AZITHROMYCIN 500 MG IV SOLR
INTRAVENOUS | Status: AC
  Filled 2015-12-10: qty 500

## 2015-12-10 MED ORDER — DEXTROSE 5 % IV SOLN
500.0000 mg | Freq: Once | INTRAVENOUS | Status: AC
  Administered 2015-12-10: 500 mg via INTRAVENOUS
  Filled 2015-12-10: qty 500

## 2015-12-10 NOTE — ED Notes (Signed)
E-sign not working.

## 2015-12-10 NOTE — ED Provider Notes (Signed)
[facility] Emergency Department Provider Note   ____________________________________________  Time seen: Approximately 5:27 AM  I have reviewed the triage vital signs and the nursing notes.   HISTORY  Chief Complaint Shortness of Breath    HPI Chelsey Mendoza is a 30 y.o. female who presents to the ED from home with a chief complaint of mid back pain and cough. Patient reports nonproductive cough for the past 2-3 days. Symptoms not associated with fever, chills, chest pain or shortness of breath. Reports she awoke this morning from sleep with sharp mid thoracic back pain which is worsened with movement and inspiration. Denies recent travel or trauma. Denies OCP use.   Past medical history None  There are no active problems to display for this patient.   No past surgical history on file.  Current Outpatient Rx  Name  Route  Sig  Dispense  Refill  . naproxen (NAPROSYN) 500 MG tablet   Oral   Take 1 tablet (500 mg total) by mouth 2 (two) times daily with a meal.   60 tablet   2   . oxymetazoline (AFRIN) 0.05 % nasal spray   Each Nare   Place 2 sprays into both nostrils 2 (two) times daily.   15 mL   2   . predniSONE (DELTASONE) 50 MG tablet      One tablet by mouth daily   5 tablet   0     Allergies Review of patient's allergies indicates no known allergies.  No family history on file.  Social History Social History  Substance Use Topics  . Smoking status: Never Smoker   . Smokeless tobacco: None  . Alcohol Use: No    Review of Systems  Constitutional: No fever/chills. Eyes: No visual changes. ENT: No sore throat. Cardiovascular: Denies chest pain. Respiratory: Positive for cough and shortness of breath. Gastrointestinal: No abdominal pain.  No nausea, no vomiting.  No diarrhea.  No constipation. Genitourinary: Negative for dysuria. Musculoskeletal: Positive for back pain. Skin: Negative for rash. Neurological: Negative  for headaches, focal weakness or numbness.  10-point ROS otherwise negative.  ____________________________________________   PHYSICAL EXAM:  VITAL SIGNS: ED Triage Vitals  Enc Vitals Group     BP 12/10/15 0241 138/81 mmHg     Pulse Rate 12/10/15 0241 98     Resp 12/10/15 0241 18     Temp 12/10/15 0241 98.2 F (36.8 C)     Temp Source 12/10/15 0241 Oral     SpO2 12/10/15 0241 100 %     Weight 12/10/15 0241 198 lb (89.812 kg)     Height 12/10/15 0241 5\' 6"  (1.676 m)     Head Cir --      Peak Flow --      Pain Score 12/10/15 0242 5     Pain Loc --      Pain Edu? --      Excl. in GC? --     Constitutional: Alert and oriented. Well appearing and in no acute distress. Eyes: Conjunctivae are normal. PERRL. EOMI. Head: Atraumatic. Nose: No congestion/rhinnorhea. Mouth/Throat: Mucous membranes are moist.  Oropharynx non-erythematous. Neck: No stridor.   Cardiovascular: Normal rate, regular rhythm. Grossly normal heart sounds.  Good peripheral circulation. Respiratory: Normal respiratory effort.  No retractions. Lungs CTAB. Gastrointestinal: Soft and nontender. No distention. No abdominal bruits. No CVA tenderness. Musculoskeletal: No thoracic back pain on palpation. No lower extremity tenderness nor edema.  No joint effusions. Neurologic:  Normal speech and  language. No gross focal neurologic deficits are appreciated. No gait instability. Skin:  Skin is warm, dry and intact. No rash noted. Psychiatric: Mood and affect are normal. Speech and behavior are normal.  ____________________________________________   LABS (all labs ordered are listed, but only abnormal results are displayed)  Labs Reviewed  CBC WITH DIFFERENTIAL/PLATELET - Abnormal; Notable for the following:    WBC 16.2 (*)    Hemoglobin 10.9 (*)    HCT 32.8 (*)    MCV 64.7 (*)    MCH 21.6 (*)    RDW 17.5 (*)    Neutro Abs 11.7 (*)    Monocytes Absolute 1.2 (*)    Basophils Absolute 0.2 (*)    All other  components within normal limits  COMPREHENSIVE METABOLIC PANEL - Abnormal; Notable for the following:    Glucose, Bld 100 (*)    All other components within normal limits  CULTURE, BLOOD (ROUTINE X 2)  CULTURE, BLOOD (ROUTINE X 2)  TROPONIN I   ____________________________________________  EKG  ED ECG REPORT I, SUNG,JADE J, the attending physician, personally viewed and interpreted this ECG.   Date: 12/10/2015  EKG Time: 0420  Rate: 87  Rhythm: normal EKG, normal sinus rhythm  Axis: Normal  Intervals:none  ST&T Change: Inverted T waves inferior lateral leads No significant change from November 2009 ____________________________________________  RADIOLOGY  Chest 2 view (view by me, interpreted per Dr. Clovis RileyMitchell): Posterior left lower lobe airspace opacity, possibly early infectious infiltrate.  CT chest interpreted per Dr. Clovis RileyMitchell: 1. Negative for acute pulmonary embolism. 2. Focal consolidation of the left lower lobe posteromedially, likely pneumonia. ____________________________________________   PROCEDURES  Procedure(s) performed: None  Critical Care performed: No  ____________________________________________   INITIAL IMPRESSION / ASSESSMENT AND PLAN / ED COURSE  Pertinent labs & imaging results that were available during my care of the patient were reviewed by me and considered in my medical decision making (see chart for details).  30 year old female who presents with thoracic back pain and cough. Laboratory and imaging results demonstrates mild leukocytosis and possible early infiltrate in the left lower lobe. It is unusual that patient would be awakened from sleep with mid thoracic back pain; will obtain CTA chest to evaluate for PE. Blood cultures and IV antibiotics ordered.  ----------------------------------------- 7:06 AM on 12/10/2015 -----------------------------------------  Updated patient and her mother of CT results. IV antibiotic infusing.  Room air saturations remained at 100%. Plan to discharge home after completion of the antibiotics. She will be discharged with prescriptions for amoxicillin, azithromycin and Tussionex. Strict return precautions given. Both verbalize understanding and agree with plan of care. ____________________________________________   FINAL CLINICAL IMPRESSION(S) / ED DIAGNOSES  Final diagnoses:  CAP (community acquired pneumonia)      NEW MEDICATIONS STARTED DURING THIS VISIT:  New Prescriptions   No medications on file     Note:  This document was prepared using Dragon voice recognition software and may include unintentional dictation errors.    Irean HongJade J Sung, MD 12/10/15 [phone]

## 2015-12-10 NOTE — Discharge Instructions (Signed)
1. Take antibiotics as prescribed: Amoxicillin 500 mg 3 times daily 7 days Azithromycin 250 mg daily 4 days 2. You may take cough medicine as needed (Tussionex). 3. Return to the ER for worsening symptoms, persistent vomiting, fever, difficult breathing or other concerns.  Community-Acquired Pneumonia, Adult Pneumonia is an infection of the lungs. There are different types of pneumonia. One type can develop while a person is in a hospital. A different type, called community-acquired pneumonia, develops in people who are not, or have not recently been, in the hospital or other health care facility.  CAUSES Pneumonia may be caused by bacteria, viruses, or funguses. Community-acquired pneumonia is often caused by Streptococcus pneumonia bacteria. These bacteria are often passed from one person to another by breathing in droplets from the cough or sneeze of an infected person. RISK FACTORS The condition is more likely to develop in:  People who havechronic diseases, such as chronic obstructive pulmonary disease (COPD), asthma, congestive heart failure, cystic fibrosis, diabetes, or kidney disease.  People who haveearly-stage or late-stage HIV.  People who havesickle cell disease.  People who havehad their spleen removed (splenectomy).  People who havepoor Administratordental hygiene.  People who havemedical conditions that increase the risk of breathing in (aspirating) secretions their own mouth and nose.   People who havea weakened immune system (immunocompromised).  People who smoke.  People whotravel to areas where pneumonia-causing germs commonly exist.  People whoare around animal habitats or animals that have pneumonia-causing germs, including birds, bats, rabbits, cats, and farm animals. SYMPTOMS Symptoms of this condition include:  Adry cough.  A wet (productive) cough.  Fever.  Sweating.  Chest pain, especially when breathing deeply or coughing.  Rapid breathing or  difficulty breathing.  Shortness of breath.  Shaking chills.  Fatigue.  Muscle aches. DIAGNOSIS Your health care provider will take a medical history and perform a physical exam. You may also have other tests, including:  Imaging studies of your chest, including X-rays.  Tests to check your blood oxygen level and other blood gases.  Other tests on blood, mucus (sputum), fluid around your lungs (pleural fluid), and urine. If your pneumonia is severe, other tests may be done to identify the specific cause of your illness. TREATMENT The type of treatment that you receive depends on many factors, such as the cause of your pneumonia, the medicines you take, and other medical conditions that you have. For most adults, treatment and recovery from pneumonia may occur at home. In some cases, treatment must happen in a hospital. Treatment may include:  Antibiotic medicines, if the pneumonia was caused by bacteria.  Antiviral medicines, if the pneumonia was caused by a virus.  Medicines that are given by mouth or through an IV tube.  Oxygen.  Respiratory therapy. Although rare, treating severe pneumonia may include:  Mechanical ventilation. This is done if you are not breathing well on your own and you cannot maintain a safe blood oxygen level.  Thoracentesis. This procedureremoves fluid around one lung or both lungs to help you breathe better. HOME CARE INSTRUCTIONS  Take over-the-counter and prescription medicines only as told by your health care provider.  Only takecough medicine if you are losing sleep. Understand that cough medicine can prevent your body's natural ability to remove mucus from your lungs.  If you were prescribed an antibiotic medicine, take it as told by your health care provider. Do not stop taking the antibiotic even if you start to feel better.  Sleep in a semi-upright position at  night. Try sleeping in a reclining chair, or place a few pillows under your  head.  Do not use tobacco products, including cigarettes, chewing tobacco, and e-cigarettes. If you need help quitting, ask your health care provider.  Drink enough water to keep your urine clear or pale yellow. This will help to thin out mucus secretions in your lungs. PREVENTION There are ways that you can decrease your risk of developing community-acquired pneumonia. Consider getting a pneumococcal vaccine if:  You are older than 32 years of age.  You are older than 32 years of age and are undergoing cancer treatment, have chronic lung disease, or have other medical conditions that affect your immune system. Ask your health care provider if this applies to you. There are different types and schedules of pneumococcal vaccines. Ask your health care provider which vaccination option is best for you. You may also prevent community-acquired pneumonia if you take these actions:  Get an influenza vaccine every year. Ask your health care provider which type of influenza vaccine is best for you.  Go to the dentist on a regular basis.  Wash your hands often. Use hand sanitizer if soap and water are not available. SEEK MEDICAL CARE IF:  You have a fever.  You are losing sleep because you cannot control your cough with cough medicine. SEEK IMMEDIATE MEDICAL CARE IF:  You have worsening shortness of breath.  You have increased chest pain.  Your sickness becomes worse, especially if you are an older adult or have a weakened immune system.  You cough up blood.   This information is not intended to replace advice given to you by your health care provider. Make sure you discuss any questions you have with your health care provider.   Document Released: 06/01/2005 Document Revised: 02/20/2015 Document Reviewed: 09/26/2014 Elsevier Interactive Patient Education [employer]2016 [employer]

## 2015-12-10 NOTE — ED Notes (Signed)
Blood draw #2: RIGHT AC

## 2015-12-10 NOTE — ED Notes (Signed)
Blood culture draw #1: LEFT AC 

## 2015-12-10 NOTE — ED Notes (Signed)
Pt woke up this am with co mid back pain that is worse when she moves or takes a deep breath. States makes her feels shob, no injury noted. Has had a cough recently.

## 2015-12-15 LAB — CULTURE, BLOOD (ROUTINE X 2)
Culture: NO GROWTH
Culture: NO GROWTH

## 2016-04-22 ENCOUNTER — Emergency Department
Admission: EM | Admit: 2016-04-22 | Discharge: 2016-04-22 | Disposition: A | Discharge status: Home / Self Care | Payer: Medicaid Other | Attending: Emergency Medicine | Admitting: Emergency Medicine

## 2016-04-22 ENCOUNTER — Encounter: Payer: Self-pay | Admitting: Emergency Medicine

## 2016-04-22 ENCOUNTER — Emergency Department: Payer: Medicaid Other

## 2016-04-22 DIAGNOSIS — Z79899 Other long term (current) drug therapy: Secondary | ICD-10-CM | POA: Diagnosis not present

## 2016-04-22 DIAGNOSIS — R51 Headache: Secondary | ICD-10-CM | POA: Diagnosis not present

## 2016-04-22 DIAGNOSIS — Z87891 Personal history of nicotine dependence: Secondary | ICD-10-CM | POA: Insufficient documentation

## 2016-04-22 DIAGNOSIS — R220 Localized swelling, mass and lump, head: Secondary | ICD-10-CM | POA: Diagnosis not present

## 2016-04-22 DIAGNOSIS — H538 Other visual disturbances: Secondary | ICD-10-CM

## 2016-04-22 DIAGNOSIS — Z791 Long term (current) use of non-steroidal anti-inflammatories (NSAID): Secondary | ICD-10-CM | POA: Diagnosis not present

## 2016-04-22 LAB — BASIC METABOLIC PANEL
Anion gap: 7 (ref 5–15)
BUN: 11 mg/dL (ref 6–20)
CHLORIDE: 105 mmol/L (ref 101–111)
CO2: 26 mmol/L (ref 22–32)
CREATININE: 0.59 mg/dL (ref 0.44–1.00)
Calcium: 9.1 mg/dL (ref 8.9–10.3)
GFR calc Af Amer: >60 mL/min (ref >60)
GFR calc non Af Amer: >60 mL/min (ref >60)
Glucose, Bld: 96 mg/dL (ref 65–99)
POTASSIUM: 3.6 mmol/L (ref 3.5–5.1)
SODIUM: 138 mmol/L (ref 135–145)

## 2016-04-22 LAB — CBC
HEMATOCRIT: 31.1 % — AB (ref 35.0–47.0)
HEMOGLOBIN: 10.3 g/dL — AB (ref 12.0–16.0)
MCH: 21.1 pg — AB (ref 26.0–34.0)
MCHC: 33.2 g/dL (ref 32.0–36.0)
MCV: 63.5 fL — ABNORMAL LOW (ref 80.0–100.0)
Platelets: 355 10*3/uL (ref 150–440)
RBC: 4.89 MIL/uL (ref 3.80–5.20)
RDW: 17.7 % — ABNORMAL HIGH (ref 11.5–14.5)
WBC: 13.1 10*3/uL — ABNORMAL HIGH (ref 3.6–11.0)

## 2016-04-22 NOTE — ED Triage Notes (Signed)
Pt comes into the ED via POV c/o headaches, blurred vision, and new masses that have presented on the posterior side of her head.  Masses are palpable and tender to the touch.  Patient states the blurred vision started around 17:00 today.  She explains that she can "focus it and look at one thing", but the rest of the room looks blurry. The headache started at the same time the masses appears which was 3 days ago.  Patient in NAD at this time and is Negative on NIH scale.

## 2016-04-22 NOTE — ED Provider Notes (Addendum)
[facility] Emergency Department Provider Note   ____________________________________________    I have reviewed the triage vital signs and the nursing notes.   HISTORY  Chief Complaint Blurry vision, lumps on head    HPI Chelsey Mendoza is a 30 y.o. female who presents with complaints of "bumps on the back of her head" which developed 2-3 days ago. She reports they are mildly painful but denies discharge. No fevers or chills. She has never had these before. She has also had an intermittent headache over the last 2 days which she describes as global and throbbing. Today she feels that her vision has been blurry all day and this is unusual for her. She is concerned this is related to the bumps on the back of her head. No neuro deficits.she reports both eyes are affected equally. Denies a history of diabetes   History reviewed. No pertinent past medical history.  There are no active problems to display for this patient.   Past Surgical History:  Procedure Laterality Date  . SKIN GRAFT      Prior to Admission medications   Medication Sig Start Date End Date Taking? Authorizing Provider  amoxicillin (AMOXIL) 500 MG capsule Take 1 capsule (500 mg total) by mouth 3 (three) times daily. 12/10/15   Irean HongJade J Sung, MD  azithromycin (ZITHROMAX) 250 MG tablet Take 1 tablet (250 mg total) by mouth daily. 12/10/15   Irean HongJade J Sung, MD  chlorpheniramine-HYDROcodone (TUSSIONEX PENNKINETIC ER) 10-8 MG/5ML SUER Take 5 mLs by mouth 2 (two) times daily. 12/10/15   Irean HongJade J Sung, MD  naproxen (NAPROSYN) 500 MG tablet Take 1 tablet (500 mg total) by mouth 2 (two) times daily with a meal. 08/31/15 08/30/16  Joni Reiningonald K Smith, PA-C  oxymetazoline (AFRIN) 0.05 % nasal spray Place 2 sprays into both nostrils 2 (two) times daily. 04/28/15   Emily FilbertJonathan E Williams, MD  predniSONE (DELTASONE) 50 MG tablet One tablet by mouth daily 04/28/15   Emily FilbertJonathan E Williams, MD     Allergies Patient has no  known allergies.  No family history on file.  Social History Social History  Substance Use Topics  . Smoking status: Former Games developermoker  . Smokeless tobacco: Never Used  . Alcohol use No    Review of Systems  Constitutional: No fever/chills Eyes: as above  Cardiovascular: Denies chest pain. Respiratory: Denies cough Gastrointestinal:No nausea, no vomiting.   Genitourinary: Negative for dysuria. Musculoskeletal: Negative for neck pain Skin: Negative for rash. Neurological: Negative for focal weakness  10-point ROS otherwise negative.  ____________________________________________   PHYSICAL EXAM:  VITAL SIGNS: ED Triage Vitals [04/22/16 2116]  Enc Vitals Group     BP (!) 156/106     Pulse Rate 95     Resp 16     Temp 98.1 F (36.7 C)     Temp Source Oral     SpO2 98 %     Weight 220 lb (99.8 kg)     Height 5\' 6"  (1.676 m)     Head Circumference      Peak Flow      Pain Score 4     Pain Loc      Pain Edu?      Excl. in GC?     Constitutional: Alert and oriented. No acute distress. Pleasant and interactive Eyes: Conjunctivae are normal. Normal funduscopic exam, PERRLA, No hyphema. No pain with EOM. No light sensitivity. No steamy cornea Head: Atraumatic.2 1 x 1 cm areas of  induration on the posterior right inferior scalp, no fluctuance or discharge. No erythema. Suspect mild folliculitis. Nose: No congestion/rhinnorhea. Mouth/Throat: Mucous membranes are moist.   Neck:  Painless ROM Cardiovascular: Normal rate, regular rhythm.   Good peripheral circulation. Respiratory: Normal respiratory effort.  No retractions.    Musculoskeletal: No lower extremity tenderness nor edema.  Warm and well perfused Neurologic:  Normal speech and language. No gross focal neurologic deficits are appreciated.  Skin:  Skin is warm, dry and intact. No rash noted. Psychiatric: Mood and affect are normal. Speech and behavior are normal.  ____________________________________________     LABS (all labs ordered are listed, but only abnormal results are displayed)  Labs Reviewed  CBC - Abnormal; Notable for the following:       Result Value   WBC 13.1 (*)    Hemoglobin 10.3 (*)    HCT 31.1 (*)    MCV 63.5 (*)    MCH 21.1 (*)    RDW 17.7 (*)    All other components within normal limits  BASIC METABOLIC PANEL   ____________________________________________  EKG  None ____________________________________________  RADIOLOGY  CT head pending ____________________________________________   PROCEDURES  Procedure(s) performed: No    Critical Care performed: No ____________________________________________   INITIAL IMPRESSION / ASSESSMENT AND PLAN / ED COURSE  Pertinent labs & imaging results that were available during my care of the patient were reviewed by me and considered in my medical decision making (see chart for details).  Patient well-appearing and in no distress.we will check visual acuity, basic lab work to evaluate for possible diabetes and CT head.  Workup is benign. No acute abnormalities noted, not consistent with glaucoma, nor with CRAO or CRVO,  Clinical Course   lab work is unremarkable ____________________________________________ CT head is unremarkable  We'll refer for close follow-up with ophthalmology  FINAL CLINICAL IMPRESSION(S) / ED DIAGNOSES  Blurry vision   NEW MEDICATIONS STARTED DURING THIS VISIT:  New Prescriptions   No medications on file     Note:  This document was prepared using Dragon voice recognition software and may include unintentional dictation errors.    Jene Everyobert Seira Cody, MD 04/22/16 [id number]    Jene Everyobert Keiri Solano, MD 04/22/16 [phone]

## 2016-07-01 ENCOUNTER — Encounter: Payer: Self-pay | Admitting: Emergency Medicine

## 2016-07-01 ENCOUNTER — Emergency Department
Admission: EM | Admit: 2016-07-01 | Discharge: 2016-07-01 | Disposition: A | Discharge status: Home / Self Care | Payer: Self-pay | Attending: Student in an Organized Health Care Education/Training Program | Admitting: Student in an Organized Health Care Education/Training Program

## 2016-07-01 ENCOUNTER — Emergency Department: Payer: Self-pay

## 2016-07-01 DIAGNOSIS — Z79899 Other long term (current) drug therapy: Secondary | ICD-10-CM | POA: Insufficient documentation

## 2016-07-01 DIAGNOSIS — R519 Headache, unspecified: Secondary | ICD-10-CM

## 2016-07-01 DIAGNOSIS — Z87891 Personal history of nicotine dependence: Secondary | ICD-10-CM | POA: Insufficient documentation

## 2016-07-01 DIAGNOSIS — R51 Headache: Secondary | ICD-10-CM | POA: Insufficient documentation

## 2016-07-01 MED ORDER — BUTALBITAL-APAP-CAFFEINE 50-325-40 MG PO TABS: 1.0000 | ORAL_TABLET | Freq: Four times a day (QID) | ORAL | 0 refills | Status: AC | PRN

## 2016-07-01 MED ORDER — CARBAMAZEPINE 200 MG PO TABS
200.0000 mg | ORAL_TABLET | Freq: Once | ORAL | Status: AC
  Administered 2016-07-01: 200 mg via ORAL
  Filled 2016-07-01: qty 1

## 2016-07-01 MED ORDER — ACETAMINOPHEN 500 MG PO TABS
1000.0000 mg | ORAL_TABLET | Freq: Once | ORAL | Status: AC
  Administered 2016-07-01: 1000 mg via ORAL
  Filled 2016-07-01: qty 2

## 2016-07-01 MED ORDER — CARBAMAZEPINE ER 100 MG PO TB12: 100.0000 mg | ORAL_TABLET | Freq: Two times a day (BID) | ORAL | 0 refills | Status: DC

## 2016-07-01 MED ORDER — PROCHLORPERAZINE EDISYLATE 5 MG/ML IJ SOLN
10.0000 mg | Freq: Once | INTRAMUSCULAR | Status: AC
  Administered 2016-07-01: 10 mg via INTRAMUSCULAR
  Filled 2016-07-01: qty 2

## 2016-07-01 MED ORDER — DIPHENHYDRAMINE HCL 50 MG/ML IJ SOLN
25.0000 mg | Freq: Once | INTRAMUSCULAR | Status: AC
Start: 2016-07-01 — End: 2016-07-01
  Administered 2016-07-01: 25 mg via INTRAMUSCULAR
  Filled 2016-07-01: qty 1

## 2016-07-01 NOTE — ED Notes (Signed)
E-signature pad malfunctioned. Patient indicated understanding of discharge instructions by making appropriate comments.

## 2016-07-01 NOTE — Discharge Instructions (Signed)

## 2016-07-01 NOTE — ED Provider Notes (Signed)
[facility] Emergency Department Provider Note    None    (approximate)  I have reviewed the triage vital signs and the nursing notes.   HISTORY  Chief Complaint Facial Pain    HPI Chelsey Mendoza is a 31 y.o. female presents with several months of intermittent paroxysmal 10 out of 10 Lancinating right facial pain that occurs in spurts.She denies any fevers. States that she is been dealing with one episode became with abrupt onset last night and has kept her awake. Visit the pain is radiating from the front of her right ear all the way to the right medial nose and forehead. States that she'll adamantly have flashing white lights to the pain. No blurry vision at this time. No trauma. No recent antibiotics. No nausea or vomiting. Denies any numbness or tingling. States the symptoms will just spontaneously resolved. Has never seen a neurologist for this before.   History reviewed. No pertinent past medical history. FMH:  No bleeding disorders Past Surgical History:  Procedure Laterality Date  . SKIN GRAFT     There are no active problems to display for this patient.     Prior to Admission medications   Medication Sig Start Date End Date Taking? Authorizing Provider  amoxicillin (AMOXIL) 500 MG capsule Take 1 capsule (500 mg total) by mouth 3 (three) times daily. 12/10/15   Irean HongJade J Sung, MD  azithromycin (ZITHROMAX) 250 MG tablet Take 1 tablet (250 mg total) by mouth daily. 12/10/15   Irean HongJade J Sung, MD  chlorpheniramine-HYDROcodone (TUSSIONEX PENNKINETIC ER) 10-8 MG/5ML SUER Take 5 mLs by mouth 2 (two) times daily. 12/10/15   Irean HongJade J Sung, MD  naproxen (NAPROSYN) 500 MG tablet Take 1 tablet (500 mg total) by mouth 2 (two) times daily with a meal. 08/31/15 08/30/16  Joni Reiningonald K Smith, PA-C  oxymetazoline (AFRIN) 0.05 % nasal spray Place 2 sprays into both nostrils 2 (two) times daily. 04/28/15   Emily FilbertJonathan E Williams, MD  predniSONE (DELTASONE) 50 MG tablet One tablet by  mouth daily 04/28/15   Emily FilbertJonathan E Williams, MD    Allergies Patient has no known allergies.    Social History Social History  Substance Use Topics  . Smoking status: Former Games developermoker  . Smokeless tobacco: Never Used  . Alcohol use No    Review of Systems Patient denies headaches, rhinorrhea, blurry vision, numbness, shortness of breath, chest pain, edema, cough, abdominal pain, nausea, vomiting, diarrhea, dysuria, fevers, rashes or hallucinations unless otherwise stated above in HPI. ____________________________________________   PHYSICAL EXAM:  VITAL SIGNS: Vitals:   07/01/16 1202  BP: (!) 169/100  Pulse: 96  Resp: 20  Temp: 98.2 F (36.8 C)    Constitutional: Alert and oriented. Well appearing and in no acute distress. Eyes: Conjunctivae are normal. PERRL. EOMI. Head: Atraumatic. Nose: No congestion/rhinnorhea. Mouth/Throat: Mucous membranes are moist.  Oropharynx non-erythematous. Neck: No stridor. Painless ROM. No cervical spine tenderness to palpation Hematological/Lymphatic/Immunilogical: No cervical lymphadenopathy. Cardiovascular: Normal rate, regular rhythm. Grossly normal heart sounds.  Good peripheral circulation. Respiratory: Normal respiratory effort.  No retractions. Lungs CTAB. Gastrointestinal: Soft and nontender. No distention. No abdominal bruits. No CVA tenderness. Musculoskeletal: No lower extremity tenderness nor edema.  No joint effusions. Neurologic:  CNI, no temporal artery ttp.  Normal speech and language. No gross focal neurologic deficits are appreciated. No gait instability. Skin:  Skin is warm, dry and intact. No rash noted. Psychiatric: Mood and affect are normal. Speech and behavior are normal. __________________________________________  LABS (all labs ordered are listed, but only abnormal results are displayed)  No results found for this or any previous visit (from the past 24  hour(s)). ____________________________________________  EKG____________________________________________  RADIOLOGY  I personally reviewed all radiographic images ordered to evaluate for the above acute complaints and reviewed radiology reports and findings.  These findings were personally discussed with the patient.  Please see medical record for radiology report.  ____________________________________________   PROCEDURES  Procedure(s) performed:  Procedures    Critical Care performed: no ____________________________________________   INITIAL IMPRESSION / ASSESSMENT AND PLAN / ED COURSE  Pertinent labs & imaging results that were available during my care of the patient were reviewed by me and considered in my medical decision making (see chart for details).  DDX: Tn, GCA, migraine, cluster, sinusitis, neuralgia  Chelsey Mendoza is a 31 y.o. who presents to the ED with with Hx of migraine p/w HA for last several months as described above.  Denies focal neurologic symptoms. Denies trauma. No fevers or neck pain. Afebrile in ED. VSS. Exam as above. No meningeal signs. No CN, motor, sensory or cerebellar deficits. Temporal arteries palpable and non-tender. Appears well and non-toxic.   Will order ct to eval for sinusitis or mass.  Based on distribution of pain and timing, I do believe this sounds consistent with trigeminal neuralgia.  possible tension, non-specific or possible migraine HA. Clinical picture is not consistent with ICH, SAH, SDH, EDH, TIA, or CVA. No concern for meningitis or encephalitis. No concern for GCA/Temporal arteritis.  CT head with NAICA.  Gave carbamazepine, Pain improved. Repeat neuro exam is again without focal deficit, nuchal rigidity or evidence of meningeal irritation.  Stable to D/C home, follow up with PCP or Neurology if persistent recurrent Has.  Have discussed with the patient and available family all diagnostics and treatments performed thus far and all  questions were answered to the best of my ability. The patient demonstrates understanding and agreement with plan.     Clinical Course      ____________________________________________   FINAL CLINICAL IMPRESSION(S) / ED DIAGNOSES  Final diagnoses:  Nonintractable episodic headache, unspecified headache type      NEW MEDICATIONS STARTED DURING THIS VISIT:  New Prescriptions   No medications on file     Note:  This document was prepared using Dragon voice recognition software and may include unintentional dictation errors.    Willy Eddy, MD 07/01/16 2021

## 2016-07-01 NOTE — ED Triage Notes (Signed)
Patient to ER for c/o severe pain to left sinus area up to left temple. Has h/o sinus issues that she has been seen here for, but states this pain is much more severe. Patient came by EMS, was hypertensive for EMS (diastolic over 100 throughout). Patient denies any medical history. Patient states pain began last night after getting off work.

## 2017-01-22 ENCOUNTER — Encounter: Payer: Self-pay | Admitting: Emergency Medicine

## 2017-01-22 ENCOUNTER — Emergency Department
Admission: EM | Admit: 2017-01-22 | Discharge: 2017-01-22 | Disposition: A | Discharge status: Home / Self Care | Payer: Medicaid Other | Attending: Emergency Medicine | Admitting: Emergency Medicine

## 2017-01-22 DIAGNOSIS — Z87891 Personal history of nicotine dependence: Secondary | ICD-10-CM | POA: Insufficient documentation

## 2017-01-22 DIAGNOSIS — Z79899 Other long term (current) drug therapy: Secondary | ICD-10-CM | POA: Insufficient documentation

## 2017-01-22 DIAGNOSIS — L02412 Cutaneous abscess of left axilla: Secondary | ICD-10-CM | POA: Insufficient documentation

## 2017-01-22 DIAGNOSIS — L02419 Cutaneous abscess of limb, unspecified: Secondary | ICD-10-CM

## 2017-01-22 MED ORDER — SULFAMETHOXAZOLE-TRIMETHOPRIM 800-160 MG PO TABS: 1.0000 | ORAL_TABLET | Freq: Two times a day (BID) | ORAL | 0 refills | Status: AC

## 2017-01-22 MED ORDER — LIDOCAINE HCL (PF) 1 % IJ SOLN
INTRAMUSCULAR | Status: AC
  Filled 2017-01-22: qty 5

## 2017-01-22 MED ORDER — LIDOCAINE HCL 1 % IJ SOLN
5.0000 mL | Freq: Once | INTRAMUSCULAR | Status: DC
  Filled 2017-01-22: qty 5

## 2017-01-22 NOTE — ED Notes (Signed)
Pt. Going home by self. 

## 2017-01-22 NOTE — ED Triage Notes (Signed)
Pt. States she noticed a boil under lt. Arm about 2 weeks ago.  Pt. States that has gone down but a new lump has emerged next to it a couple days ago.  Pt. States itching to site at first, but now discomfort.

## 2017-01-22 NOTE — ED Provider Notes (Signed)
[facility] Emergency Department Provider Note  ____________________________________________  Time seen: Approximately 7:13 PM  I have reviewed the triage vital signs and the nursing notes.   HISTORY  Chief Complaint Mass (Pt. has hard lump in lt. arm pit)    HPI  Chelsey Mendoza is a 31 y.o. female presenting to the emergency department with a left axillary abscess. Patient states that abscess has been apparent for the past 2 weeks. Patient has tried warm compresses. She has been afebrile.   History reviewed. No pertinent past medical history.  There are no active problems to display for this patient.   Past Surgical History:  Procedure Laterality Date  . SKIN GRAFT      Prior to Admission medications   Medication Sig Start Date End Date Taking? Authorizing Provider  butalbital-acetaminophen-caffeine (FIORICET, ESGIC) [phone] MG tablet Take 1-2 tablets by mouth every 6 (six) hours as needed for headache. 07/01/16 07/01/17  Willy Eddyobinson, Patrick, MD  carbamazepine (TEGRETOL-XR) 100 MG 12 hr tablet Take 1 tablet (100 mg total) by mouth 2 (two) times daily. 07/01/16 07/08/16  Willy Eddyobinson, Patrick, MD  chlorpheniramine-HYDROcodone (TUSSIONEX PENNKINETIC ER) 10-8 MG/5ML SUER Take 5 mLs by mouth 2 (two) times daily. Patient not taking: Reported on 07/01/2016 12/10/15   Irean HongSung, Jade J, MD  ibuprofen (ADVIL,MOTRIN) 200 MG tablet Take 400 mg by mouth every 6 (six) hours as needed.    [provider]  oxymetazoline (AFRIN) 0.05 % nasal spray Place 2 sprays into both nostrils 2 (two) times daily. 04/28/15   Emily FilbertWilliams, Jonathan E, MD  predniSONE (DELTASONE) 50 MG tablet One tablet by mouth daily 04/28/15   Emily FilbertWilliams, Jonathan E, MD  sulfamethoxazole-trimethoprim (BACTRIM DS,SEPTRA DS) 800-160 MG tablet Take 1 tablet by mouth 2 (two) times daily. 01/22/17 01/29/17  Orvil FeilWoods, Jaclyn M, PA-C    Allergies Patient has no known allergies.  History reviewed. No pertinent family  history.  Social History Social History  Substance Use Topics  . Smoking status: Former Games developermoker  . Smokeless tobacco: Never Used  . Alcohol use No     Review of Systems  Constitutional: No fever/chills Eyes: No visual changes. No discharge ENT: No upper respiratory complaints. Cardiovascular: no chest pain. Respiratory: no cough. No SOB. Gastrointestinal: No abdominal pain.  No nausea, no vomiting.  No diarrhea.  No constipation. Musculoskeletal: Negative for musculoskeletal pain. Skin: She has left axillary abscess. Neurological: Negative for headaches, focal weakness or numbness.  ____________________________________________   PHYSICAL EXAM:  VITAL SIGNS: ED Triage Vitals  Enc Vitals Group     BP 01/22/17 1814 (!) 144/90     Pulse Rate 01/22/17 1814 82     Resp 01/22/17 1814 18     Temp 01/22/17 1814 98.3 F (36.8 C)     Temp src --      SpO2 01/22/17 1814 100 %     Weight 01/22/17 1815 198 lb (89.8 kg)     Height 01/22/17 1815 5\' 6"  (1.676 m)     Head Circumference --      Peak Flow --      Pain Score 01/22/17 1814 5     Pain Loc --      Pain Edu? --      Excl. in GC? --      Constitutional: Alert and oriented. Well appearing and in no acute distress. Eyes: Conjunctivae are normal. PERRL. EOMI. Head: Atraumatic. Cardiovascular: Normal rate, regular rhythm. Normal S1 and S2.  Good peripheral circulation. Respiratory: Normal respiratory effort  without tachypnea or retractions. Lungs CTAB. Good air entry to the bases with no decreased or absent breath sounds. Musculoskeletal: Full range of motion to all extremities. No gross deformities appreciated. Neurologic:  Normal speech and language. No gross focal neurologic deficits are appreciated.  Skin:  Patient has a 2 cm x 2 cm left axillary abscess. No surrounding cellulitis. Palpable induration but no fluctuance. Psychiatric: Mood and affect are normal. Speech and behavior are normal. Patient exhibits appropriate  insight and judgement.   ____________________________________________   LABS (all labs ordered are listed, but only abnormal results are displayed)  Labs Reviewed - No data to display ____________________________________________  EKG   ____________________________________________  RADIOLOGY   No results found.  ____________________________________________    PROCEDURES  Procedure(s) performed:    Procedures  INCISION AND DRAINAGE Performed by: Orvil Feil Consent: Verbal consent obtained. Risks and benefits: risks, benefits and alternatives were discussed Type: abscess  Body area: Left axillary  Anesthesia: local infiltration  Incision was made with a scalpel.  Local anesthetic: lidocaine 1% without epinephrine  Anesthetic total: 3 ml  Complexity: complex Blunt dissection to break up loculations  Drainage: purulent  Drainage amount: 5 mL   Packing material: 1/4 in iodoform gauze  Patient tolerance: Patient tolerated the procedure well with no immediate complications.     Medications  lidocaine (XYLOCAINE) 1 % (with pres) injection 5 mL (not administered)  lidocaine (PF) (XYLOCAINE) 1 % injection (not administered)     ____________________________________________   INITIAL IMPRESSION / ASSESSMENT AND PLAN / ED COURSE  Pertinent labs & imaging results that were available during my care of the patient were reviewed by me and considered in my medical decision making (see chart for details).  Review of the [place] CSRS was performed in accordance of the [facility] prior to dispensing any controlled drugs.     Assessment and plan Axillary abscess Patient presents to the emergency department with a left axillary abscess. Patient underwent incision and drainage without complication. Patient was advised to have packing removed by primary care in 3 days. Patient was discharged with Bactrim. Vital signs are reassuring prior to  discharge. ____________________________________________  FINAL CLINICAL IMPRESSION(S) / ED DIAGNOSES  Final diagnoses:  Axillary abscess      NEW MEDICATIONS STARTED DURING THIS VISIT:  New Prescriptions   SULFAMETHOXAZOLE-TRIMETHOPRIM (BACTRIM DS,SEPTRA DS) 800-160 MG TABLET    Take 1 tablet by mouth 2 (two) times daily.        This chart was dictated using voice recognition software/Dragon. Despite best efforts to proofread, errors can occur which can change the meaning. Any change was purely unintentional.    Orvil Feil, PA-C 01/22/17 1918    Sharman Cheek, MD 01/25/17 2328

## 2017-06-28 IMAGING — CT CT HEAD W/O CM
3 series · 15 of 45 positions shown, 18 images · non-contrast
Comparison: None.

CLINICAL DATA: Headaches, blurred vision.

EXAM:
CT HEAD WITHOUT CONTRAST
TECHNIQUE: Contiguous axial images were obtained from the base of the skull
through the vertex without intravenous contrast.

[Series 3: head wo · axial · 0.42mm/px · z∈[+442,+557]mm · 9 of 28 slices shown, 12 images]
[im 3/28  brain]
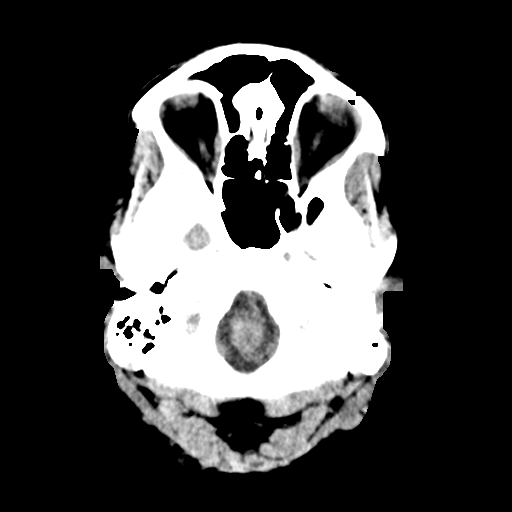
[im 3/28  bone]
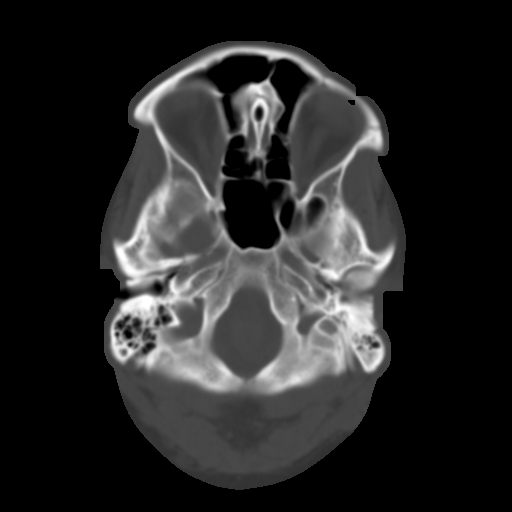
[im 6/28  brain]
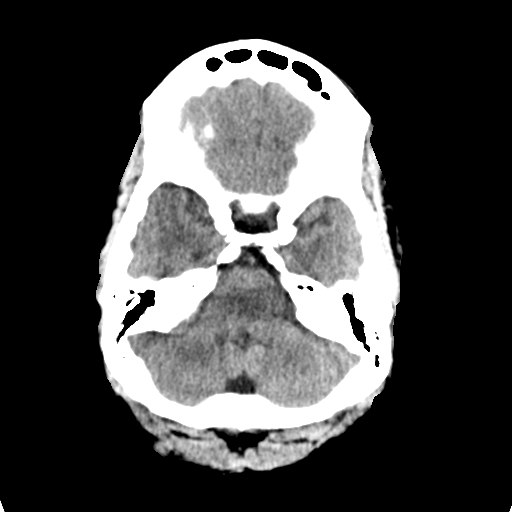
[im 9/28  brain]
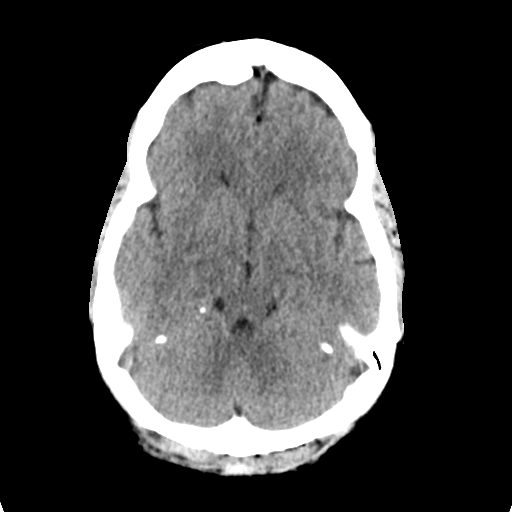
[im 12/28  brain]
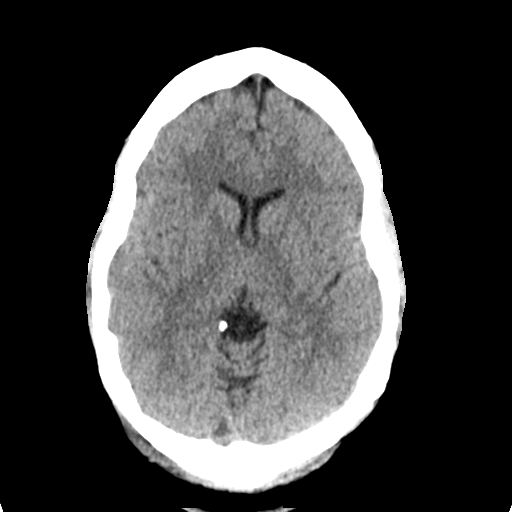
[im 15/28  brain]
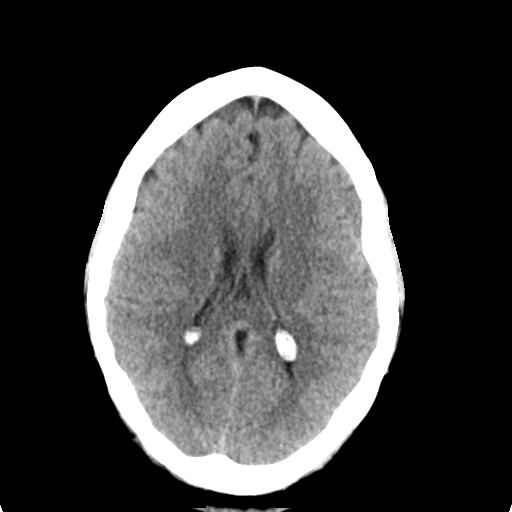
[im 15/28  bone]
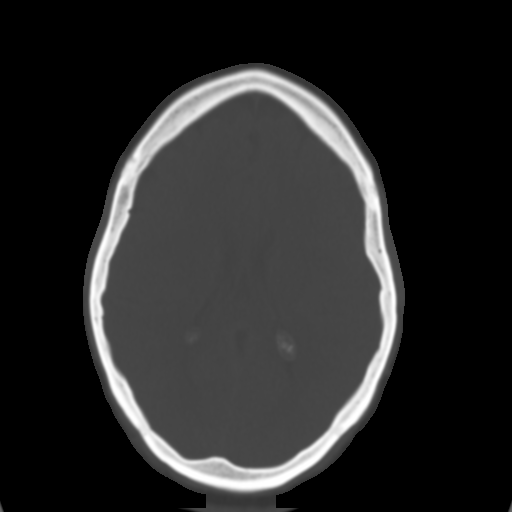
[im 17/28  brain]
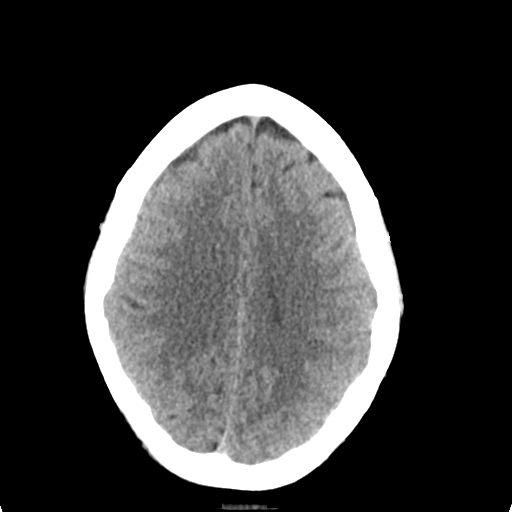
[im 20/28  brain]
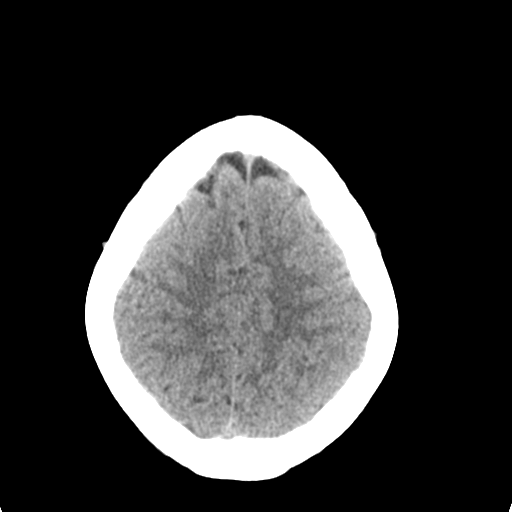
[im 23/28  brain]
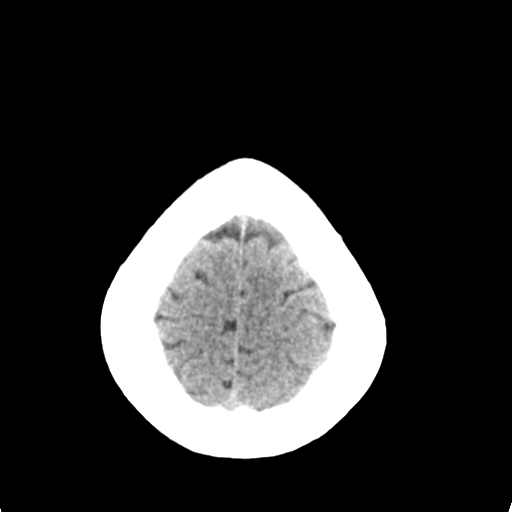
[im 26/28  brain]
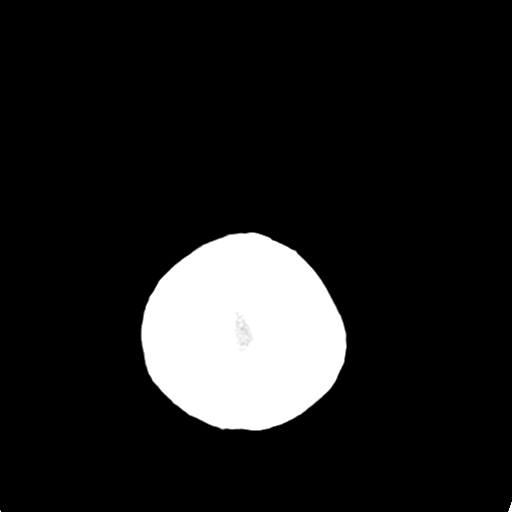
[im 26/28  bone]
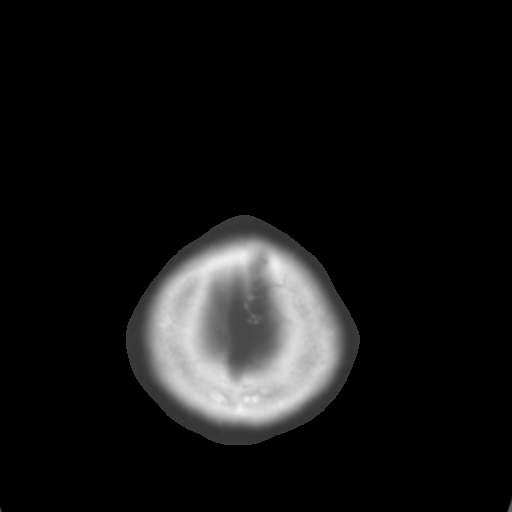

[Series 4: coronal soft tissue · coronal · 0.29mm/px · 3 of 61 slices shown]
[im 21/61  brain]
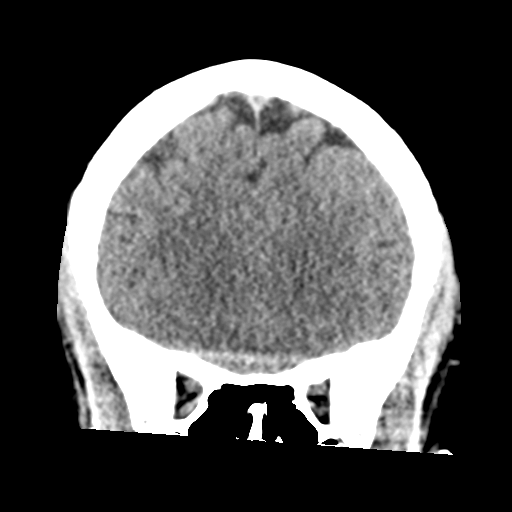
[im 27/61  brain]
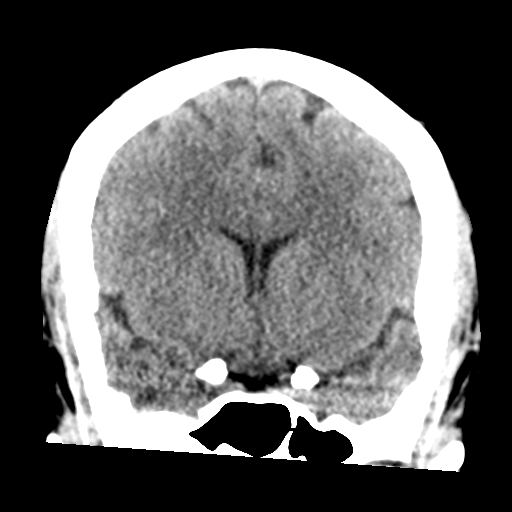
[im 34/61  brain]
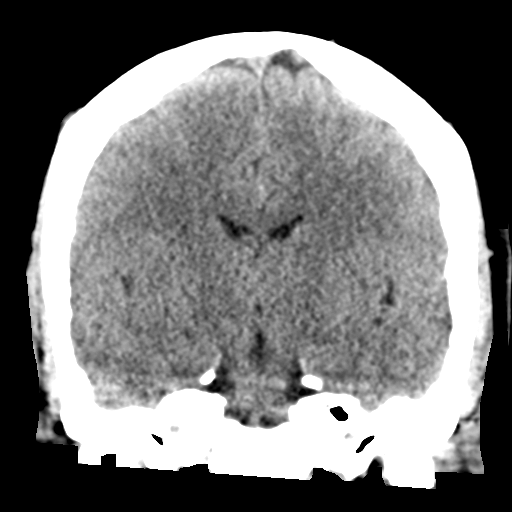

[Series 5: sagittal soft tissue · sagittal · 0.27mm/px · 3 of 50 slices shown]
[im 17/50  brain]
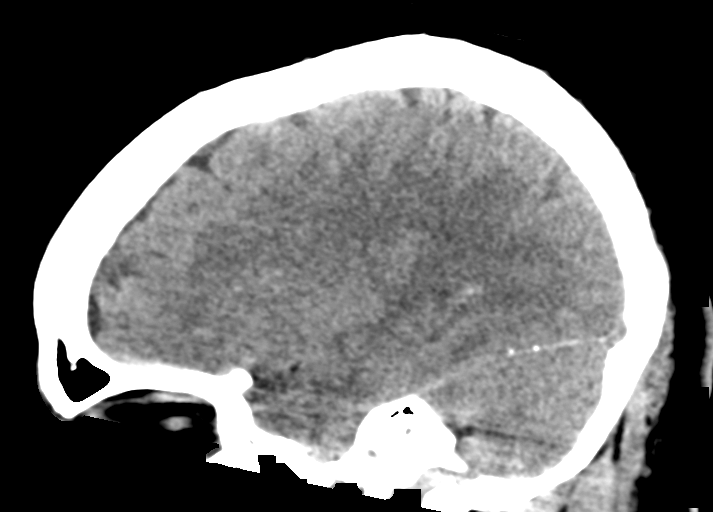
[im 25/50  brain]
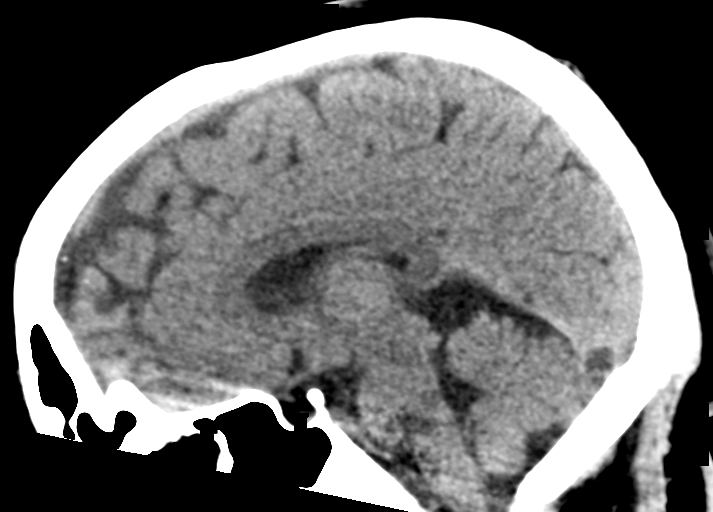
[im 33/50  brain]
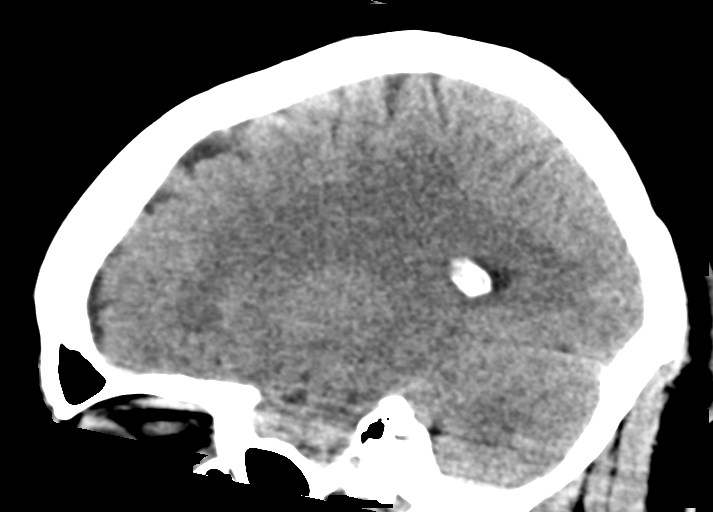

[15 of 45 positions shown; findings below may reference images not displayed]

FINDINGS: Brain: No mass lesion, intraparenchymal hemorrhage or extra-axial
collection. No evidence of acute cortical infarct. Brain parenchyma
and CSF-containing spaces are normal for age.

Vascular: No hyperdense vessel or unexpected calcification.

Skull: Normal visualized skull base and calvarium. There are 2
nodular soft tissue foci measuring approximately 6 mm at the right
occipital scalp, nonspecific. No aggressive features.

Sinuses/Orbits: No sinus fluid levels or advanced mucosal
thickening. No mastoid effusion. Normal orbits.
IMPRESSION: 1. Normal appearance of the brain.
2. Nodular foci of soft tissue attenuation at the right posterior
scalp might be sebaceous cysts with associated mild inflammation.
There is no extension to the skull or other aggressive feature.

## 2018-11-09 ENCOUNTER — Emergency Department
Admission: EM | Admit: 2018-11-09 | Discharge: 2018-11-09 | Discharge status: Against Medical Advice | Payer: Self-pay | Attending: Emergency Medicine | Admitting: Emergency Medicine

## 2018-11-09 ENCOUNTER — Emergency Department: Payer: Self-pay

## 2018-11-09 ENCOUNTER — Other Ambulatory Visit: Payer: Self-pay

## 2018-11-09 DIAGNOSIS — Z79899 Other long term (current) drug therapy: Secondary | ICD-10-CM | POA: Insufficient documentation

## 2018-11-09 DIAGNOSIS — N939 Abnormal uterine and vaginal bleeding, unspecified: Secondary | ICD-10-CM | POA: Insufficient documentation

## 2018-11-09 DIAGNOSIS — R1032 Left lower quadrant pain: Secondary | ICD-10-CM | POA: Insufficient documentation

## 2018-11-09 DIAGNOSIS — Z87891 Personal history of nicotine dependence: Secondary | ICD-10-CM | POA: Insufficient documentation

## 2018-11-09 DIAGNOSIS — Z5329 Procedure and treatment not carried out because of patient's decision for other reasons: Secondary | ICD-10-CM | POA: Insufficient documentation

## 2018-11-09 DIAGNOSIS — R350 Frequency of micturition: Secondary | ICD-10-CM | POA: Insufficient documentation

## 2018-11-09 LAB — URINALYSIS, COMPLETE (UACMP) WITH MICROSCOPIC
Bilirubin Urine: NEGATIVE
Glucose, UA: NEGATIVE mg/dL
Ketones, ur: NEGATIVE mg/dL
Nitrite: NEGATIVE
Protein, ur: 30 mg/dL — AB
RBC / HPF: >50 RBC/hpf — ABNORMAL HIGH (ref 0–5)
Specific Gravity, Urine: 1.024 (ref 1.005–1.030)
pH: 5 (ref 5.0–8.0)

## 2018-11-09 LAB — BASIC METABOLIC PANEL
Anion gap: 10 (ref 5–15)
BUN: 12 mg/dL (ref 6–20)
CO2: 25 mmol/L (ref 22–32)
Calcium: 9 mg/dL (ref 8.9–10.3)
Chloride: 106 mmol/L (ref 98–111)
Creatinine, Ser: 0.65 mg/dL (ref 0.44–1.00)
GFR calc Af Amer: >60 mL/min (ref >60)
GFR calc non Af Amer: >60 mL/min (ref >60)
Glucose, Bld: 107 mg/dL — ABNORMAL HIGH (ref 70–99)
Potassium: 3.9 mmol/L (ref 3.5–5.1)
Sodium: 141 mmol/L (ref 135–145)

## 2018-11-09 LAB — CBC
HCT: 35.3 % — ABNORMAL LOW (ref 36.0–46.0)
Hemoglobin: 11 g/dL — ABNORMAL LOW (ref 12.0–15.0)
MCH: 20.8 pg — ABNORMAL LOW (ref 26.0–34.0)
MCHC: 31.2 g/dL (ref 30.0–36.0)
MCV: 66.9 fL — ABNORMAL LOW (ref 80.0–100.0)
Platelets: 436 10*3/uL — ABNORMAL HIGH (ref 150–400)
RBC: 5.28 MIL/uL — ABNORMAL HIGH (ref 3.87–5.11)
RDW: 17.4 % — ABNORMAL HIGH (ref 11.5–15.5)
WBC: 13.5 10*3/uL — ABNORMAL HIGH (ref 4.0–10.5)
nRBC: 0 % (ref 0.0–0.2)

## 2018-11-09 LAB — POCT PREGNANCY, URINE: Preg Test, Ur: NEGATIVE

## 2018-11-09 NOTE — ED Notes (Signed)
Reports usually has heavy bleeding with her monthly cycle, this time bleeding is heavier and is throwing very big clots. Wetting 3 pads an hour. Awaiting md eval and plan of care.

## 2018-11-09 NOTE — ED Triage Notes (Signed)
Pt states she started her normal cycle 4 days ago but is having heavy bleeding, passing large clots with abd cramping. States she has her tubes tied.

## 2018-11-09 NOTE — ED Provider Notes (Signed)
[facility] Emergency Department Provider Note  ____________________________________________  Time seen: Approximately 3:46 PM  I have reviewed the triage vital signs and the nursing notes.   HISTORY  Chief Complaint Vaginal Bleeding    HPI Chelsey Mendoza is a 33 y.o. female that presents emergency department for evaluation of heavy vaginal bleeding with clots for 5 days.  She developed left lower quadrant pain yesterday.  Patient states that menstrual cycle usually lasts about 4 days.  She has also had some frequency of urination.  She has had her tubes tied.  She is not on any hormonal birth control.  She does not smoke.  No fever, vomiting, vaginal discharge, dysuria.   History reviewed. No pertinent past medical history.  There are no active problems to display for this patient.   Past Surgical History:  Procedure Laterality Date  . SKIN GRAFT    . TUBAL LIGATION      Prior to Admission medications   Medication Sig Start Date End Date Taking? Authorizing Provider  carbamazepine (TEGRETOL-XR) 100 MG 12 hr tablet Take 1 tablet (100 mg total) by mouth 2 (two) times daily. 07/01/16 07/08/16  Willy Eddyobinson, Patrick, MD  chlorpheniramine-HYDROcodone (TUSSIONEX PENNKINETIC ER) 10-8 MG/5ML SUER Take 5 mLs by mouth 2 (two) times daily. Patient not taking: Reported on 07/01/2016 12/10/15   Irean HongSung, Jade J, MD  ibuprofen (ADVIL,MOTRIN) 200 MG tablet Take 400 mg by mouth every 6 (six) hours as needed.    [provider]  oxymetazoline (AFRIN) 0.05 % nasal spray Place 2 sprays into both nostrils 2 (two) times daily. 04/28/15   Emily FilbertWilliams, Jonathan E, MD  predniSONE (DELTASONE) 50 MG tablet One tablet by mouth daily 04/28/15   Emily FilbertWilliams, Jonathan E, MD    Allergies Patient has no known allergies.  No family history on file.  Social History Social History   Tobacco Use  . Smoking status: Former Games developermoker  . Smokeless tobacco: Never Used  Substance Use Topics  .  Alcohol use: Yes  . Drug use: No     Review of Systems  Constitutional: No fever/chills Cardiovascular: No chest pain. Respiratory: No SOB. Gastrointestinal: Positive for abdominal pain.  No nausea, no vomiting.  Musculoskeletal: Negative for musculoskeletal pain. Skin: Negative for rash, abrasions, lacerations, ecchymosis.   ____________________________________________   PHYSICAL EXAM:  VITAL SIGNS: ED Triage Vitals  Enc Vitals Group     BP 11/09/18 1340 (!) 135/96     Pulse Rate 11/09/18 1337 86     Resp 11/09/18 1337 18     Temp 11/09/18 1337 98 F (36.7 C)     Temp Source 11/09/18 1337 Oral     SpO2 11/09/18 1337 99 %     Weight 11/09/18 1338 188 lb (85.3 kg)     Height 11/09/18 1338 5\' 5"  (1.651 m)     Head Circumference --      Peak Flow --      Pain Score 11/09/18 1337 8     Pain Loc --      Pain Edu? --      Excl. in GC? --      Constitutional: Alert and oriented. Well appearing and in no acute distress. Eyes: Conjunctivae are normal. PERRL. EOMI. Head: Atraumatic. ENT:      Ears:      Nose: No congestion/rhinnorhea.      Mouth/Throat: Mucous membranes are moist.  Neck: No stridor.   Cardiovascular: Normal rate, regular rhythm.  Good peripheral circulation. Respiratory: Normal respiratory  effort without tachypnea or retractions. Lungs CTAB. Good air entry to the bases with no decreased or absent breath sounds. Gastrointestinal: Mild left lower quadrant tenderness to palpation. No guarding or rigidity. No palpable masses. No distention. No CVA tenderness. Musculoskeletal: Full range of motion to all extremities. No gross deformities appreciated. Neurologic:  Normal speech and language. No gross focal neurologic deficits are appreciated.  Skin:  Skin is warm, dry and intact. No rash noted. Psychiatric: Mood and affect are normal. Speech and behavior are normal. Patient exhibits appropriate insight and  judgement.   ____________________________________________   LABS (all labs ordered are listed, but only abnormal results are displayed)  Labs Reviewed  BASIC METABOLIC PANEL - Abnormal; Notable for the following components:      Result Value   Glucose, Bld 107 (*)    All other components within normal limits  CBC - Abnormal; Notable for the following components:   WBC 13.5 (*)    RBC 5.28 (*)    Hemoglobin 11.0 (*)    HCT 35.3 (*)    MCV 66.9 (*)    MCH 20.8 (*)    RDW 17.4 (*)    Platelets 436 (*)    All other components within normal limits  WET PREP, GENITAL  CHLAMYDIA/NGC RT PCR ([facility] ONLY)  URINALYSIS, COMPLETE (UACMP) WITH MICROSCOPIC  POC URINE PREG, ED  POCT PREGNANCY, URINE   ____________________________________________  EKG   ____________________________________________  RADIOLOGY   No results found.  ____________________________________________    PROCEDURES  Procedure(s) performed:    Procedures    Medications - No data to display   ____________________________________________   INITIAL IMPRESSION / ASSESSMENT AND PLAN / ED COURSE  Pertinent labs & imaging results that were available during my care of the patient were reviewed by me and considered in my medical decision making (see chart for details).  Review of the  [place] was performed in accordance of the [place] prior to dispensing any controlled drugs.     Patient presented to emergency department for evaluation of heavy vaginal bleeding and left lower quadrant pain.  Pregnancy test is negative.  CBC remarkable for WBC of 13.5, RBC 5.28, hemoglobin 11, hematocrit 35.3, platelets, 436.  Urinalysis, wet prep, gonorrhea chlamydia, pelvic complete with transvaginal ultrasound were ordered.  Patient eloped awaiting testing.     ____________________________________________  FINAL CLINICAL IMPRESSION(S) / ED DIAGNOSES  Final diagnoses:  Vaginal bleeding      NEW  MEDICATIONS STARTED DURING THIS VISIT:  ED Discharge Orders    None          This chart was dictated using voice recognition software/Dragon. Despite best efforts to proofread, errors can occur which can change the meaning. Any change was purely unintentional.    Enid Derry, PA-C 11/09/18 2353    Arnaldo Natal, MD 11/10/18 Marlyne Beards

## 2018-11-09 NOTE — ED Notes (Signed)
Awaiting pelvic exam.

## 2019-04-29 ENCOUNTER — Emergency Department: Payer: Self-pay

## 2019-04-29 ENCOUNTER — Emergency Department
Admission: EM | Admit: 2019-04-29 | Discharge: 2019-04-29 | Disposition: A | Discharge status: Home / Self Care | Payer: Self-pay | Attending: Emergency Medicine | Admitting: Emergency Medicine

## 2019-04-29 ENCOUNTER — Other Ambulatory Visit: Payer: Self-pay

## 2019-04-29 ENCOUNTER — Encounter: Payer: Self-pay | Admitting: Emergency Medicine

## 2019-04-29 DIAGNOSIS — Z20822 Contact with and (suspected) exposure to covid-19: Secondary | ICD-10-CM

## 2019-04-29 DIAGNOSIS — Z20828 Contact with and (suspected) exposure to other viral communicable diseases: Secondary | ICD-10-CM | POA: Insufficient documentation

## 2019-04-29 DIAGNOSIS — J189 Pneumonia, unspecified organism: Secondary | ICD-10-CM | POA: Insufficient documentation

## 2019-04-29 LAB — SARS CORONAVIRUS 2 (TAT 6-24 HRS): SARS Coronavirus 2: NEGATIVE

## 2019-04-29 MED ORDER — AZITHROMYCIN 250 MG PO TABS: ORAL_TABLET | ORAL | 0 refills | Status: DC

## 2019-04-29 MED ORDER — ALBUTEROL SULFATE HFA 108 (90 BASE) MCG/ACT IN AERS: 2.0000 | INHALATION_SPRAY | Freq: Four times a day (QID) | RESPIRATORY_TRACT | 0 refills | Status: DC | PRN

## 2019-04-29 MED ORDER — PREDNISONE 10 MG (21) PO TBPK: ORAL_TABLET | ORAL | 0 refills | Status: DC

## 2019-04-29 NOTE — ED Provider Notes (Signed)
[facility] Emergency Department Provider Note  ____________________________________________   First MD Initiated Contact with Patient 04/29/19 1356     (approximate)  I have reviewed the triage vital signs and the nursing notes.   HISTORY  Chief Complaint Cough    HPI Lemar Livingsydrean Novitski is a 33 y.o. female presents emergency department complaining of a cough and some shortness of breath.  States one of her coworkers that sits diagonally from her tested positive for Covid last Sunday.  She states she has had symptoms for approximately 3 days.  No fever or chills.  Retains her sense of taste and smell.  She does states that she is a little winded with talking.  No abdominal pain, vomiting or diarrhea.    History reviewed. No pertinent past medical history.  There are no active problems to display for this patient.   Past Surgical History:  Procedure Laterality Date   SKIN GRAFT     TUBAL LIGATION      Prior to Admission medications   Medication Sig Start Date End Date Taking? Authorizing Provider  albuterol (VENTOLIN HFA) 108 (90 Base) MCG/ACT inhaler Inhale 2 puffs into the lungs every 6 (six) hours as needed for wheezing or shortness of breath. 04/29/19   Sherrie MustacheFisher, Roselyn BeringSusan W, PA-C  azithromycin (ZITHROMAX Z-PAK) 250 MG tablet 2 pills today then 1 pill a day for 4 days 04/29/19   Sherrie MustacheFisher, Roselyn BeringSusan W, PA-C  carbamazepine (TEGRETOL-XR) 100 MG 12 hr tablet Take 1 tablet (100 mg total) by mouth 2 (two) times daily. 07/01/16 07/08/16  Willy Eddyobinson, Patrick, MD  ibuprofen (ADVIL,MOTRIN) 200 MG tablet Take 400 mg by mouth every 6 (six) hours as needed.    [provider]  oxymetazoline (AFRIN) 0.05 % nasal spray Place 2 sprays into both nostrils 2 (two) times daily. 04/28/15   Emily FilbertWilliams, Jonathan E, MD  predniSONE (STERAPRED UNI-PAK 21 TAB) 10 MG (21) TBPK tablet Take 6 pills on day one then decrease by 1 pill each day 04/29/19   Faythe GheeFisher, Kateena Degroote W, PA-C     Allergies Patient has no known allergies.  No family history on file.  Social History Social History   Tobacco Use   Smoking status: Former Smoker   Smokeless tobacco: Never Used  Substance Use Topics   Alcohol use: Yes   Drug use: No    Review of Systems  Constitutional: No fever/chills Eyes: No visual changes. ENT: No sore throat. Respiratory: Positive cough and some shortness of breath Genitourinary: Negative for dysuria. Musculoskeletal: Negative for back pain. Skin: Negative for rash.    ____________________________________________   PHYSICAL EXAM:  VITAL SIGNS: ED Triage Vitals  Enc Vitals Group     BP 04/29/19 1337 (!) 160/93     Pulse Rate 04/29/19 1337 87     Resp 04/29/19 1337 20     Temp 04/29/19 1337 99.1 F (37.3 C)     Temp Source 04/29/19 1337 Oral     SpO2 04/29/19 1337 100 %     Weight 04/29/19 1338 197 lb (89.4 kg)     Height 04/29/19 1338 5\' 5"  (1.651 m)     Head Circumference --      Peak Flow --      Pain Score 04/29/19 1338 0     Pain Loc --      Pain Edu? --      Excl. in GC? --     Constitutional: Alert and oriented. Well appearing and in no acute distress.  Eyes: Conjunctivae are normal.  Head: Atraumatic. Nose: No congestion/rhinnorhea. Mouth/Throat: Mucous membranes are moist.   Neck:  supple no lymphadenopathy noted Cardiovascular: Normal rate, regular rhythm. Heart sounds are normal Respiratory: Normal respiratory effort.  No retractions, lungs with decreased breath sounds bilaterally GU: deferred Musculoskeletal: FROM all extremities, warm and well perfused Neurologic:  Normal speech and language.  Skin:  Skin is warm, dry and intact. No rash noted. Psychiatric: Mood and affect are normal. Speech and behavior are normal.  ____________________________________________   LABS (all labs ordered are listed, but only abnormal results are displayed)  Labs Reviewed  SARS CORONAVIRUS 2 (TAT 6-24 HRS)    ____________________________________________   ____________________________________________  RADIOLOGY  Chest x-ray AP shows a hazy amount of bronchial markings in the right lower lung.  Radiologist's reading is normal.  I actually have concerns for right middle lobe pneumonia.  ____________________________________________   PROCEDURES  Procedure(s) performed: No  Procedures    ____________________________________________   INITIAL IMPRESSION / ASSESSMENT AND PLAN / ED COURSE  Pertinent labs & imaging results that were available during my care of the patient were reviewed by me and considered in my medical decision making (see chart for details).   Patient is 33 year old female presents emergency department with concerns of Covid/pneumonia.  Patient has a coworker that tested positive that sits diagonally from her in a cubicle.  See HPI  Physical exam shows patient to appear well.  Vitals are basically normal.  Lungs with some decreased lung sounds.  Chest x-ray shows haziness in the right lower lung.  Radiologist says is normal.  I have concerns of middle lobe pneumonia.  Patient has had a history of pneumonia and states this feels the same.  Explained everything to the patient.  She was given a prescription for Z-Pak, Sterapred, albuterol inhaler.  She was also given a Covid test.  Work note stating that she should not return until Covid test results have been called to her.  If negative she may return to work next day.  If positive she should remain out of work an additional 10 days.  She states she understands will comply.  She is discharged stable condition.    Mildreth Reek was evaluated in Emergency Department on 04/29/2019 for the symptoms described in the history of present illness. She was evaluated in the context of the global COVID-19 pandemic, which necessitated consideration that the patient might be at risk for infection with the SARS-CoV-2 virus that causes  COVID-19. Institutional protocols and algorithms that pertain to the evaluation of patients at risk for COVID-19 are in a state of rapid change based on information released by regulatory bodies including the CDC and federal and state organizations. These policies and algorithms were followed during the patient's care in the ED.   As part of my medical decision making, I reviewed the following data within the [place] notes reviewed and incorporated, Old chart reviewed, Radiograph reviewed chest x-ray in showing questionable right middle lobe pneumonia, Notes from prior ED visits and [place] Controlled Substance Database  ____________________________________________   FINAL CLINICAL IMPRESSION(S) / ED DIAGNOSES  Final diagnoses:  Suspected COVID-19 virus infection  Community acquired pneumonia of right middle lobe of lung      NEW MEDICATIONS STARTED DURING THIS VISIT:  Discharge Medication List as of 04/29/2019  2:45 PM    START taking these medications   Details  albuterol (VENTOLIN HFA) 108 (90 Base) MCG/ACT inhaler Inhale 2 puffs into the lungs  every 6 (six) hours as needed for wheezing or shortness of breath., Starting Sat 04/29/2019, Normal    azithromycin (ZITHROMAX Z-PAK) 250 MG tablet 2 pills today then 1 pill a day for 4 days, Normal    predniSONE (STERAPRED UNI-PAK 21 TAB) 10 MG (21) TBPK tablet Take 6 pills on day one then decrease by 1 pill each day, Normal         Note:  This document was prepared using Dragon voice recognition software and may include unintentional dictation errors.    Faythe Ghee, PA-C 04/29/19 Alda Lea    Jene Every, MD 04/30/19 [phone]

## 2019-04-29 NOTE — ED Triage Notes (Signed)
Cough x 2 days, possible exposure to COVID. States feels SOB. Speaking in full sentences. No fevers.

## 2019-04-29 NOTE — ED Notes (Signed)
Pt states one of her coworkers positive for covid - she c/o sudden onset of cough and sob.

## 2019-04-29 NOTE — Discharge Instructions (Signed)
Follow-up with your regular doctor return emergency department if not improving in 2 to 3 days.  Take your medications as prescribed.  Covid test will result in 6 to 24 hours.  You may take 48 hours for your the results.  You should quarantine until you have received your results.  Take over-the-counter Mucinex along with your medications to decrease mucous plugs in the lungs.  Return to the emergency department if you are worsening.

## 2019-10-30 ENCOUNTER — Emergency Department
Admission: EM | Admit: 2019-10-30 | Discharge: 2019-10-30 | Disposition: A | Discharge status: Home / Self Care | Payer: PRIVATE HEALTH INSURANCE | Attending: Student | Admitting: Student

## 2019-10-30 ENCOUNTER — Other Ambulatory Visit: Payer: Self-pay

## 2019-10-30 ENCOUNTER — Emergency Department: Payer: PRIVATE HEALTH INSURANCE

## 2019-10-30 ENCOUNTER — Encounter: Payer: Self-pay | Admitting: Emergency Medicine

## 2019-10-30 DIAGNOSIS — Y999 Unspecified external cause status: Secondary | ICD-10-CM | POA: Insufficient documentation

## 2019-10-30 DIAGNOSIS — Z87891 Personal history of nicotine dependence: Secondary | ICD-10-CM | POA: Insufficient documentation

## 2019-10-30 DIAGNOSIS — Y929 Unspecified place or not applicable: Secondary | ICD-10-CM | POA: Insufficient documentation

## 2019-10-30 DIAGNOSIS — X58XXXA Exposure to other specified factors, initial encounter: Secondary | ICD-10-CM | POA: Insufficient documentation

## 2019-10-30 DIAGNOSIS — S161XXA Strain of muscle, fascia and tendon at neck level, initial encounter: Secondary | ICD-10-CM

## 2019-10-30 DIAGNOSIS — Y939 Activity, unspecified: Secondary | ICD-10-CM | POA: Insufficient documentation

## 2019-10-30 MED ORDER — HYDROCODONE-ACETAMINOPHEN 5-325 MG PO TABS: 1.0000 | ORAL_TABLET | Freq: Four times a day (QID) | ORAL | 0 refills | Status: DC | PRN

## 2019-10-30 MED ORDER — METHOCARBAMOL 500 MG PO TABS: 500.0000 mg | ORAL_TABLET | Freq: Four times a day (QID) | ORAL | 0 refills | Status: DC

## 2019-10-30 NOTE — Discharge Instructions (Signed)
Follow-up with your primary care provider if any continued problems.  Use ice or heat to your neck as needed.  Take medication only as directed and when you are at home as both these medications could cause drowsiness and increase your risk for injury.

## 2019-10-30 NOTE — ED Triage Notes (Signed)
C/O neck pain.  Seen by message therapist.  Had a neck adjustment 2-3 days ago.  Since that time c/o right clavicular pain and left sided headache.  AAOx3.  Skin warm and dry. NAD

## 2019-10-30 NOTE — ED Notes (Signed)
See triage note  Presents with right neck and clavicular pain  States she had an adjustment couple of days ago   Then a massage   States the therapist felt a "knot" to area

## 2019-10-30 NOTE — ED Provider Notes (Signed)
[facility] Emergency Department Provider Note  ____________________________________________   First MD Initiated Contact with Patient 10/30/19 1051     (approximate)  I have reviewed the triage vital signs and the nursing notes.   HISTORY  Chief Complaint No chief complaint on file.   HPI Chelsey Mendoza is a 34 y.o. female presents to the ED with complaint of right-sided neck pain that goes down into her clavicle.  Patient states that she had a massage 3 days ago and was told by the therapist that she "felt a knot" in the area.  Patient states that since that time she has continued to have pain.  She denies any injuries to her neck in the past or recently.  She rates her pain as 7 out of 10.       History reviewed. No pertinent past medical history.  There are no problems to display for this patient.   Past Surgical History:  Procedure Laterality Date  . SKIN GRAFT    . TUBAL LIGATION      Prior to Admission medications   Medication Sig Start Date End Date Taking? Authorizing Provider  albuterol (VENTOLIN HFA) 108 (90 Base) MCG/ACT inhaler Inhale 2 puffs into the lungs every 6 (six) hours as needed for wheezing or shortness of breath. 04/29/19   Fisher, Roselyn Bering, PA-C  carbamazepine (TEGRETOL-XR) 100 MG 12 hr tablet Take 1 tablet (100 mg total) by mouth 2 (two) times daily. 07/01/16 07/08/16  Willy Eddy, MD  HYDROcodone-acetaminophen (NORCO/VICODIN) 5-325 MG tablet Take 1 tablet by mouth every 6 (six) hours as needed for moderate pain. 10/30/19   Tommi Rumps, PA-C  ibuprofen (ADVIL,MOTRIN) 200 MG tablet Take 400 mg by mouth every 6 (six) hours as needed.    [provider]  methocarbamol (ROBAXIN) 500 MG tablet Take 1 tablet (500 mg total) by mouth 4 (four) times daily. 10/30/19   Tommi Rumps, PA-C  oxymetazoline (AFRIN) 0.05 % nasal spray Place 2 sprays into both nostrils 2 (two) times daily. 04/28/15   Emily Filbert, MD     Allergies Patient has no known allergies.  No family history on file.  Social History Social History   Tobacco Use  . Smoking status: Former Games developer  . Smokeless tobacco: Never Used  Substance Use Topics  . Alcohol use: Yes  . Drug use: No    Review of Systems Constitutional: No fever/chills Cardiovascular: Denies chest pain. Respiratory: Denies shortness of breath. Gastrointestinal:   No nausea, no vomiting.  Musculoskeletal: Positive for cervical pain. Skin: Negative for rash. Neurological: Positive for headaches, no focal weakness or numbness. ____________________________________________   PHYSICAL EXAM:  VITAL SIGNS: ED Triage Vitals  Enc Vitals Group     BP 10/30/19 1002 (!) 155/96     Pulse Rate 10/30/19 1002 97     Resp 10/30/19 1002 19     Temp 10/30/19 1002 98.4 F (36.9 C)     Temp Source 10/30/19 1002 Oral     SpO2 10/30/19 1002 100 %     Weight 10/30/19 1004 196 lb (88.9 kg)     Height 10/30/19 1004 5\' 6"  (1.676 m)     Head Circumference --      Peak Flow --      Pain Score 10/30/19 1015 7     Pain Loc --      Pain Edu? --      Excl. in GC? --     Constitutional: Alert and  oriented. Well appearing and in no acute distress. Eyes: Conjunctivae are normal.  Head: Atraumatic. Nose: No congestion/rhinnorhea. Mouth/Throat: Mucous membranes are moist.  Oropharynx non-erythematous. Neck: No stridor.  No point tenderness on palpation of cervical spine and no step-offs are noted.  There is moderate soft tissue tenderness to palpation. Cardiovascular: Normal rate, regular rhythm. Grossly normal heart sounds.  Good peripheral circulation. Respiratory: Normal respiratory effort.  No retractions. Lungs CTAB. Musculoskeletal: Patient moves upper and lower extremities with any difficulty and ambulates without any assistance.. Neurologic:  Normal speech and language. No gross focal neurologic deficits are appreciated. No gait instability. Skin:  Skin is warm,  dry and intact. No rash noted. Psychiatric: Mood and affect are normal. Speech and behavior are normal.  ____________________________________________   LABS (all labs ordered are listed, but only abnormal results are displayed)  Labs Reviewed - No data to display ____________________________________________   RADIOLOGY   Official radiology report(s): DG Cervical Spine 2-3 Views  Result Date: 10/30/2019 CLINICAL DATA:  Neck pain for 2 months EXAM: CERVICAL SPINE - 2-3 VIEW COMPARISON:  None. FINDINGS: There is no evidence of cervical spine fracture or prevertebral soft tissue swelling. Normal facet joint alignment. Dens and lateral masses remain aligned. Straightening of the cervical lordosis. No other significant bone abnormalities are identified. IMPRESSION: 1. Negative cervical spine radiographs. 2. Straightening of the cervical lordosis, which may be positional or secondary to muscular strain. Electronically Signed   By: Davina Poke D.O.   On: 10/30/2019 12:39    ____________________________________________   PROCEDURES  Procedure(s) performed (including Critical Care):  Procedures  ____________________________________________   INITIAL IMPRESSION / ASSESSMENT AND PLAN / ED COURSE  As part of my medical decision making, I reviewed the following data within the electronic MEDICAL RECORD NUMBER Notes from prior ED visits and [place] Controlled Substance Database  34 year old female presents to the ED with complaint of right sided neck pain into her clavicle after having massage over the weekend.  Patient denies any injury to her neck.  She denies any paresthesias into her upper extremities.  Physical exam is positive for soft tissue tenderness.  X-rays were negative for acute bony injury.  Patient was reassured.  She is encouraged to use ice or heat to her muscles as needed for discomfort.  She was discharged with a prescription for methocarbamol and hydrocodone with acetaminophen.   Patient is to follow-up with her PCP if any continued problems or [facility] acute care.  ____________________________________________   FINAL CLINICAL IMPRESSION(S) / ED DIAGNOSES  Final diagnoses:  Cervical strain, acute, initial encounter     ED Discharge Orders         Ordered    HYDROcodone-acetaminophen (NORCO/VICODIN) 5-325 MG tablet  Every 6 hours PRN     10/30/19 1306    methocarbamol (ROBAXIN) 500 MG tablet  4 times daily     10/30/19 1306           Note:  This document was prepared using Dragon voice recognition software and may include unintentional dictation errors.    Johnn Hai, PA-C 10/30/19 1511    Lilia Pro., MD 10/31/19 2136

## 2020-01-31 ENCOUNTER — Other Ambulatory Visit: Payer: Self-pay

## 2020-01-31 ENCOUNTER — Emergency Department
Admission: EM | Admit: 2020-01-31 | Discharge: 2020-01-31 | Disposition: A | Discharge status: Home / Self Care | Payer: BLUE CROSS/BLUE SHIELD | Attending: Emergency Medicine | Admitting: Emergency Medicine

## 2020-01-31 ENCOUNTER — Emergency Department: Payer: BLUE CROSS/BLUE SHIELD

## 2020-01-31 DIAGNOSIS — D649 Anemia, unspecified: Secondary | ICD-10-CM | POA: Insufficient documentation

## 2020-01-31 DIAGNOSIS — Z20822 Contact with and (suspected) exposure to covid-19: Secondary | ICD-10-CM

## 2020-01-31 DIAGNOSIS — J069 Acute upper respiratory infection, unspecified: Secondary | ICD-10-CM | POA: Insufficient documentation

## 2020-01-31 DIAGNOSIS — U071 COVID-19: Secondary | ICD-10-CM | POA: Insufficient documentation

## 2020-01-31 DIAGNOSIS — R0789 Other chest pain: Secondary | ICD-10-CM

## 2020-01-31 DIAGNOSIS — Z87891 Personal history of nicotine dependence: Secondary | ICD-10-CM | POA: Insufficient documentation

## 2020-01-31 DIAGNOSIS — I10 Essential (primary) hypertension: Secondary | ICD-10-CM

## 2020-01-31 DIAGNOSIS — R05 Cough: Secondary | ICD-10-CM | POA: Diagnosis present

## 2020-01-31 LAB — CBC
HCT: 32.8 % — ABNORMAL LOW (ref 36.0–46.0)
Hemoglobin: 10.4 g/dL — ABNORMAL LOW (ref 12.0–15.0)
MCH: 19.8 pg — ABNORMAL LOW (ref 26.0–34.0)
MCHC: 31.7 g/dL (ref 30.0–36.0)
MCV: 62.5 fL — ABNORMAL LOW (ref 80.0–100.0)
Platelets: 370 10*3/uL (ref 150–400)
RBC: 5.25 MIL/uL — ABNORMAL HIGH (ref 3.87–5.11)
RDW: 18.6 % — ABNORMAL HIGH (ref 11.5–15.5)
WBC: 6.7 10*3/uL (ref 4.0–10.5)
nRBC: 0 % (ref 0.0–0.2)

## 2020-01-31 LAB — BASIC METABOLIC PANEL
Anion gap: 10 (ref 5–15)
BUN: 8 mg/dL (ref 6–20)
CO2: 25 mmol/L (ref 22–32)
Calcium: 8.7 mg/dL — ABNORMAL LOW (ref 8.9–10.3)
Chloride: 104 mmol/L (ref 98–111)
Creatinine, Ser: 0.82 mg/dL (ref 0.44–1.00)
GFR calc Af Amer: >60 mL/min (ref >60)
GFR calc non Af Amer: >60 mL/min (ref >60)
Glucose, Bld: 100 mg/dL — ABNORMAL HIGH (ref 70–99)
Potassium: 3.6 mmol/L (ref 3.5–5.1)
Sodium: 139 mmol/L (ref 135–145)

## 2020-01-31 LAB — SARS CORONAVIRUS 2 BY RT PCR (HOSPITAL ORDER, PERFORMED IN ~~LOC~~ HOSPITAL LAB): SARS Coronavirus 2: POSITIVE — AB

## 2020-01-31 LAB — TROPONIN I (HIGH SENSITIVITY): Troponin I (High Sensitivity): <2 ng/L (ref <18)

## 2020-01-31 MED ORDER — ACETAMINOPHEN 500 MG PO TABS
1000.0000 mg | ORAL_TABLET | Freq: Once | ORAL | Status: AC
  Administered 2020-01-31: 1000 mg via ORAL
  Filled 2020-01-31: qty 2

## 2020-01-31 MED ORDER — BENZONATATE 100 MG PO CAPS: 100.0000 mg | ORAL_CAPSULE | Freq: Four times a day (QID) | ORAL | 0 refills | Status: AC | PRN

## 2020-01-31 MED ORDER — NAPROXEN 500 MG PO TABS
500.0000 mg | ORAL_TABLET | Freq: Once | ORAL | Status: AC
  Administered 2020-01-31: 500 mg via ORAL
  Filled 2020-01-31: qty 1

## 2020-01-31 NOTE — ED Provider Notes (Signed)
[facility] Emergency Department Provider Note  ____________________________________________   First MD Initiated Contact with Patient 01/31/20 1620     (approximate)  I have reviewed the triage vital signs and the nursing notes.   HISTORY  Chief Complaint Cough and Shortness of Breath   HPI Chelsey Mendoza is a 34 y.o. female with a past medical history of tubal ligation who presents for assessment approximately 5 days of chest tightness associated with nonproductive cough, shortness of breath, and loose stools.  Patient denies any headache, earache, sore throat, fevers, vomiting, dysuria, abdominal pain, back pain, extremity pain, rash, or other acute complaints.  No clear alleviating aggravating factors.  No prior similar episodes.  Patient denies tobacco use or EtOH use.  Denies illegal drug use.  No other acute physical complaints at this time.  Patient denies any hemoptysis, history of DVT/PE, or estrogen use.  No recent surgeries.         History reviewed. No pertinent past medical history.  There are no problems to display for this patient.   Past Surgical History:  Procedure Laterality Date  . SKIN GRAFT    . TUBAL LIGATION      Prior to Admission medications   Medication Sig Start Date End Date Taking? Authorizing Provider  albuterol (VENTOLIN HFA) 108 (90 Base) MCG/ACT inhaler Inhale 2 puffs into the lungs every 6 (six) hours as needed for wheezing or shortness of breath. 04/29/19   Fisher, Roselyn Bering, PA-C  benzonatate (TESSALON PERLES) 100 MG capsule Take 1 capsule (100 mg total) by mouth every 6 (six) hours as needed for up to 7 days for cough. 01/31/20 02/07/20  Gilles Chiquito, MD  carbamazepine (TEGRETOL-XR) 100 MG 12 hr tablet Take 1 tablet (100 mg total) by mouth 2 (two) times daily. 07/01/16 07/08/16  Willy Eddy, MD  HYDROcodone-acetaminophen (NORCO/VICODIN) 5-325 MG tablet Take 1 tablet by mouth every 6 (six) hours as needed for  moderate pain. 10/30/19   Tommi Rumps, PA-C  ibuprofen (ADVIL,MOTRIN) 200 MG tablet Take 400 mg by mouth every 6 (six) hours as needed.    [provider]  methocarbamol (ROBAXIN) 500 MG tablet Take 1 tablet (500 mg total) by mouth 4 (four) times daily. 10/30/19   Tommi Rumps, PA-C  oxymetazoline (AFRIN) 0.05 % nasal spray Place 2 sprays into both nostrils 2 (two) times daily. 04/28/15   Emily Filbert, MD    Allergies Patient has no known allergies.  History reviewed. No pertinent family history.  Social History Social History   Tobacco Use  . Smoking status: Former Games developer  . Smokeless tobacco: Never Used  Substance Use Topics  . Alcohol use: Yes  . Drug use: No    Review of Systems  Review of Systems  Constitutional: Negative for chills and fever.  HENT: Negative for sore throat.   Eyes: Negative for pain.  Respiratory: Positive for cough and shortness of breath. Negative for stridor.   Cardiovascular: Positive for chest pain.  Gastrointestinal: Positive for diarrhea. Negative for vomiting.  Skin: Negative for rash.  Neurological: Negative for seizures, loss of consciousness and headaches.  Psychiatric/Behavioral: Negative for suicidal ideas.  All other systems reviewed and are negative.     ____________________________________________   PHYSICAL EXAM:  VITAL SIGNS: ED Triage Vitals [01/31/20 1150]  Enc Vitals Group     BP (!) 158/104     Pulse Rate 85     Resp 20     Temp 98.5 F (  36.9 C)     Temp Source Oral     SpO2 100 %     Weight 213 lb (96.6 kg)     Height 5\' 6"  (1.676 m)     Head Circumference      Peak Flow      Pain Score 2     Pain Loc      Pain Edu?      Excl. in [place]?    Vitals:   01/31/20 1150 01/31/20 1500  BP: (!) 158/104 (!) 149/103  Pulse: 85 86  Resp: 20 14  Temp: 98.5 F (36.9 C) 99.8 F (37.7 C)  SpO2: 100% 100%   Physical Exam Vitals and nursing note reviewed.  Constitutional:      General: She  is not in acute distress.    Appearance: She is well-developed.  HENT:     Head: Normocephalic and atraumatic.     Right Ear: External ear normal.     Left Ear: External ear normal.     Nose: Nose normal.     Mouth/Throat:     Mouth: Mucous membranes are moist.  Eyes:     Conjunctiva/sclera: Conjunctivae normal.  Cardiovascular:     Rate and Rhythm: Normal rate and regular rhythm.     Pulses: Normal pulses.     Heart sounds: No murmur heard.   Pulmonary:     Effort: Pulmonary effort is normal. No respiratory distress.     Breath sounds: Normal breath sounds.  Abdominal:     Palpations: Abdomen is soft.     Tenderness: There is no abdominal tenderness.  Musculoskeletal:     Cervical back: Neck supple.     Right lower leg: No edema.     Left lower leg: No edema.  Skin:    General: Skin is warm and dry.     Capillary Refill: Capillary refill takes less than 2 seconds.  Neurological:     Mental Status: She is alert and oriented to person, place, and time.  Psychiatric:        Mood and Affect: Mood normal.      ____________________________________________   LABS (all labs ordered are listed, but only abnormal results are displayed)  Labs Reviewed  BASIC METABOLIC PANEL - Abnormal; Notable for the following components:      Result Value   Glucose, Bld 100 (*)    Calcium 8.7 (*)    All other components within normal limits  CBC - Abnormal; Notable for the following components:   RBC 5.25 (*)    Hemoglobin 10.4 (*)    HCT 32.8 (*)    MCV 62.5 (*)    MCH 19.8 (*)    RDW 18.6 (*)    All other components within normal limits  SARS CORONAVIRUS 2 BY RT PCR (HOSPITAL ORDER, PERFORMED IN [place] [facility])  POC URINE PREG, ED  TROPONIN I (HIGH SENSITIVITY)   ____________________________________________  EKG  Sinus rhythm with a ventricle rate of 87, normal axis, unremarkable intervals, nonspecific T wave changes in inferior leads and no other evidence of acute  ischemia or other significant underlying arrhythmia.  These changes are unchanged when compared to ECG obtained in 2017. ____________________________________________  RADIOLOGY  ED MD interpretation: Unremarkable.  Official radiology report(s): DG Chest 2 View  Result Date: 01/31/2020 CLINICAL DATA:  Chest pain, shortness of breath, and cough for 3 days. Fatigue. EXAM: CHEST - 2 VIEW COMPARISON:  04/29/2019 FINDINGS: The heart size and mediastinal contours  are within normal limits. Both lungs are clear. The visualized skeletal structures are unremarkable. IMPRESSION: No active cardiopulmonary disease. Electronically Signed   By: Danae Orleans M.D.   On: 01/31/2020 12:36    ____________________________________________   PROCEDURES  Procedure(s) performed (including Critical Care):  Procedures   ____________________________________________   INITIAL IMPRESSION / ASSESSMENT AND PLAN / ED COURSE        Patient presents with left to history exam for assessment of cough associated with chest tightness, shortness of breath with exertion, diarrhea.  Patient is afebrile and hemodynamically stable arrival.  Overall presentation is most consistent with likely viral bronchitis with enteritis.  COVID-19 is high within differential.  No evidence of focal consolidation on chest x-ray to suggest bacterial pneumonia or other acute intrathoracic process on chest x-ray.  Low suspicion for ACS given no acute changes on EKG and nonelevated troponin with symptom onset greater than 6 hours ago.  Low suspicion for PE as patient is PERC negative.  Patient is noted be slightly anemic but very close to her baseline.  There are no significant electrolyte or metabolic derangements on BMP.  We will plan to send COVID-19 PCR.  Patient given below noted analgesia.  Rx written for [employer].  Isolation precautions discussed.  Discharged stable condition.  Strict return precautions advised and discussed.   In  addition isolation precautions were discussed with patient that she should follow-up with her PCP in 10 to 15 days when she recovers from her acute illness to have her blood pressure rechecked.  ____________________________________________   FINAL CLINICAL IMPRESSION(S) / ED DIAGNOSES  Final diagnoses:  Viral URI with cough  Chest tightness  Person under investigation for COVID-19  Anemia, unspecified type     ED Discharge Orders         Ordered    benzonatate (TESSALON PERLES) 100 MG capsule  Every 6 hours PRN     Discontinue  Reprint     01/31/20 1634           Note:  This document was prepared using Dragon voice recognition software and may include unintentional dictation errors.   Gilles Chiquito, MD 01/31/20 1640

## 2020-01-31 NOTE — ED Triage Notes (Signed)
Pt arrives via POV from home for reports of chest pressure, cough and inability to take a deep breath in since awakening. Pt reports not "feeling herself" the last 2 or 3 days and states she has felt more fatigued with a headache. Pt ambulatory from triage in NAD, speaking in complete sentences without shob, skin warm and dry.

## 2020-02-01 ENCOUNTER — Telehealth: Payer: Self-pay | Admitting: Emergency Medicine

## 2020-02-01 ENCOUNTER — Telehealth: Payer: Self-pay | Admitting: Infectious Diseases

## 2020-02-01 NOTE — Telephone Encounter (Signed)
Called patient to assure she is aware of covid result positive.  She says she did see in mychart and has no quesitons about guidelines.

## 2020-02-01 NOTE — Telephone Encounter (Signed)
Called to Discuss with patient about Covid symptoms and the use of the monoclonal antibody infusion for those with mild to moderate Covid symptoms and at a high risk of hospitalization.     Pt appears to qualify for this infusion due to co-morbid conditions and/or a member of an at-risk group in accordance with the FDA Emergency Use Authorization.    Sx started 6 days ago now per chart review.    She would like to consider and have information sent to her MyChart account. Will notify me tomorrow if she would like to be scheduled.

## 2020-05-19 ENCOUNTER — Other Ambulatory Visit: Payer: Self-pay

## 2020-05-19 ENCOUNTER — Emergency Department
Admission: EM | Admit: 2020-05-19 | Discharge: 2020-05-19 | Disposition: A | Discharge status: Home / Self Care | Payer: BLUE CROSS/BLUE SHIELD | Attending: Emergency Medicine | Admitting: Emergency Medicine

## 2020-05-19 ENCOUNTER — Encounter: Payer: Self-pay | Admitting: Emergency Medicine

## 2020-05-19 DIAGNOSIS — M542 Cervicalgia: Secondary | ICD-10-CM | POA: Insufficient documentation

## 2020-05-19 DIAGNOSIS — Z5321 Procedure and treatment not carried out due to patient leaving prior to being seen by health care provider: Secondary | ICD-10-CM | POA: Insufficient documentation

## 2020-05-19 NOTE — ED Notes (Signed)
Attempted to call pt with no answer 

## 2020-05-19 NOTE — ED Triage Notes (Signed)
Patient states that she was at yoga this morning and felt a pop in her neck. Patient states that she has had pain since then.

## 2020-05-19 NOTE — ED Notes (Signed)
Pt called for treatment room, no answer.  

## 2020-05-19 NOTE — ED Triage Notes (Signed)
Attempted to call pt in the lobby x3, checked the bathroom and outside with no answer

## 2022-02-06 ENCOUNTER — Emergency Department
Admission: EM | Admit: 2022-02-06 | Discharge: 2022-02-06 | Discharge status: Against Medical Advice | Payer: Self-pay | Attending: Emergency Medicine | Admitting: Emergency Medicine

## 2022-02-06 ENCOUNTER — Other Ambulatory Visit: Payer: Self-pay

## 2022-02-06 DIAGNOSIS — S0990XA Unspecified injury of head, initial encounter: Secondary | ICD-10-CM | POA: Insufficient documentation

## 2022-02-06 DIAGNOSIS — H9319 Tinnitus, unspecified ear: Secondary | ICD-10-CM | POA: Insufficient documentation

## 2022-02-06 DIAGNOSIS — Z5321 Procedure and treatment not carried out due to patient leaving prior to being seen by health care provider: Secondary | ICD-10-CM | POA: Insufficient documentation

## 2022-02-06 DIAGNOSIS — Y92410 Unspecified street and highway as the place of occurrence of the external cause: Secondary | ICD-10-CM | POA: Insufficient documentation

## 2022-02-06 NOTE — ED Notes (Signed)
Pt was called by [employer] [facility] to be taken to a treatment room. Pt declined to go back at this time, stating that she needed to go outside.

## 2022-02-06 NOTE — ED Notes (Signed)
Pt has not returned to the lobby at this time 

## 2022-02-06 NOTE — ED Notes (Signed)
Pt not visualized in the lobby. Pt never checked back in at the front desk to be taken back to room.
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
## 2022-02-20 ENCOUNTER — Encounter: Payer: Self-pay | Admitting: Emergency Medicine

## 2022-02-20 ENCOUNTER — Other Ambulatory Visit: Payer: Self-pay

## 2022-02-20 DIAGNOSIS — M549 Dorsalgia, unspecified: Secondary | ICD-10-CM | POA: Insufficient documentation

## 2022-02-20 DIAGNOSIS — Y92410 Unspecified street and highway as the place of occurrence of the external cause: Secondary | ICD-10-CM | POA: Insufficient documentation

## 2022-02-20 DIAGNOSIS — Z5321 Procedure and treatment not carried out due to patient leaving prior to being seen by health care provider: Secondary | ICD-10-CM | POA: Insufficient documentation

## 2022-02-20 DIAGNOSIS — M542 Cervicalgia: Secondary | ICD-10-CM | POA: Insufficient documentation
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
## 2022-02-21 ENCOUNTER — Emergency Department
Admission: EM | Admit: 2022-02-21 | Discharge: 2022-02-21 | Discharge status: Against Medical Advice | Payer: Self-pay | Attending: Emergency Medicine | Admitting: Emergency Medicine

## 2022-05-29 ENCOUNTER — Emergency Department
Admission: EM | Admit: 2022-05-29 | Discharge: 2022-05-29 | Discharge status: Against Medical Advice | Payer: Self-pay | Attending: Emergency Medicine | Admitting: Emergency Medicine

## 2022-05-29 DIAGNOSIS — Z5321 Procedure and treatment not carried out due to patient leaving prior to being seen by health care provider: Secondary | ICD-10-CM | POA: Insufficient documentation

## 2022-05-29 DIAGNOSIS — R110 Nausea: Secondary | ICD-10-CM | POA: Insufficient documentation

## 2022-05-29 LAB — CBC
HCT: 36.8 % (ref 36.0–46.0)
Hemoglobin: 11.6 g/dL — ABNORMAL LOW (ref 12.0–15.0)
MCH: 22.9 pg — ABNORMAL LOW (ref 26.0–34.0)
MCHC: 31.5 g/dL (ref 30.0–36.0)
MCV: 72.7 fL — ABNORMAL LOW (ref 80.0–100.0)
Platelets: 310 10*3/uL (ref 150–400)
RBC: 5.06 MIL/uL (ref 3.87–5.11)
RDW: 15.5 % (ref 11.5–15.5)
WBC: 8.1 10*3/uL (ref 4.0–10.5)
nRBC: 0 % (ref 0.0–0.2)

## 2022-05-29 LAB — COMPREHENSIVE METABOLIC PANEL
ALT: 12 U/L (ref 0–44)
AST: 16 U/L (ref 15–41)
Albumin: 3.7 g/dL (ref 3.5–5.0)
Alkaline Phosphatase: 57 U/L (ref 38–126)
Anion gap: 7 (ref 5–15)
BUN: 19 mg/dL (ref 6–20)
CO2: 25 mmol/L (ref 22–32)
Calcium: 8.8 mg/dL — ABNORMAL LOW (ref 8.9–10.3)
Chloride: 107 mmol/L (ref 98–111)
Creatinine, Ser: 0.82 mg/dL (ref 0.44–1.00)
GFR, Estimated: >60 mL/min (ref >60)
Glucose, Bld: 86 mg/dL (ref 70–99)
Potassium: 3.6 mmol/L (ref 3.5–5.1)
Sodium: 139 mmol/L (ref 135–145)
Total Bilirubin: 0.4 mg/dL (ref 0.3–1.2)
Total Protein: 6.9 g/dL (ref 6.5–8.1)

## 2022-05-29 NOTE — ED Notes (Signed)
No answer for room x 1 

## 2022-05-29 NOTE — ED Notes (Signed)
No answer for room x3 

## 2022-05-29 NOTE — ED Triage Notes (Signed)
Pt reports 3-4 days of constant nausea, denies vomiting or diarrhea. Denies abd pain. Reports smells makes the nausea worse. Pt reports not pregnant, tubes are tied. Denies any other s/s or fever.

## 2022-05-29 NOTE — ED Notes (Signed)
NA for room x2 2135

## 2022-06-24 ENCOUNTER — Emergency Department
Admission: EM | Admit: 2022-06-24 | Discharge: 2022-06-24 | Disposition: A | Discharge status: Home / Self Care | Payer: Self-pay | Attending: Emergency Medicine | Admitting: Emergency Medicine

## 2022-06-24 ENCOUNTER — Emergency Department: Payer: Self-pay

## 2022-06-24 ENCOUNTER — Other Ambulatory Visit: Payer: Self-pay

## 2022-06-24 DIAGNOSIS — Z20822 Contact with and (suspected) exposure to covid-19: Secondary | ICD-10-CM | POA: Insufficient documentation

## 2022-06-24 DIAGNOSIS — J101 Influenza due to other identified influenza virus with other respiratory manifestations: Secondary | ICD-10-CM | POA: Insufficient documentation

## 2022-06-24 LAB — RESP PANEL BY RT-PCR (RSV, FLU A&B, COVID)  RVPGX2
Influenza A by PCR: POSITIVE — AB
Influenza B by PCR: NEGATIVE
Resp Syncytial Virus by PCR: NEGATIVE
SARS Coronavirus 2 by RT PCR: NEGATIVE

## 2022-06-24 NOTE — ED Provider Triage Note (Signed)
Emergency Medicine Provider Triage Evaluation Note  Chelsey Mendoza, a 37 y.o. female  was evaluated in triage.  Pt complains of cold symptoms for several weeks. She presents today with worsening cough, subjective fevers, and chest pressure and heaviness  Review of Systems  Positive: Cough, subj fevers Negative: NVD  Physical Exam  BP (!) 165/101 (BP Location: Left Arm)   Pulse 99   Temp 97.8 F (36.6 C) (Oral)   Resp 16   LMP 05/11/2022 (Approximate) Comment: tubal ligation  SpO2 99%  Gen:   Awake, no distress  NAD Resp:  Normal effort CTA MSK:   Moves extremities without difficulty  Other:    Medical Decision Making  Medically screening exam initiated at 11:19 AM.  Appropriate orders placed.  Helyn Numbers was informed that the remainder of the evaluation will be completed by another provider, this initial triage assessment does not replace that evaluation, and the importance of remaining in the ED until their evaluation is complete.  Patient to the ED for evaluation of cold symptoms with worsening cough over the last few days.    Melvenia Needles, PA-C 06/24/22 1121

## 2022-06-24 NOTE — ED Triage Notes (Signed)
Pt states that she has cold symptoms for the past couple weeks, states that her cough is getting worse and states that she has heaviness in her chest

## 2022-06-24 NOTE — ED Provider Notes (Addendum)
[facility] Emergency Department Provider Note     Event Date/Time   First MD Initiated Contact with Patient 06/24/22 1151     (approximate)   History   Cough   HPI  Chelsey Mendoza is a 37 y.o. female  complains of cold symptoms for several weeks. She presents today with worsening cough, subjective fevers, and chest pressure and heaviness.  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   Physical Exam   Triage Vital Signs: ED Triage Vitals  Enc Vitals Group     BP 06/24/22 1117 (!) 165/101     Pulse Rate 06/24/22 1117 99     Resp 06/24/22 1117 16     Temp 06/24/22 1117 97.8 F (36.6 C)     Temp Source 06/24/22 1117 Oral     SpO2 06/24/22 1117 99 %     Weight 06/24/22 1119 178 lb (80.7 kg)     Height 06/24/22 1119 5\' 5"  (1.651 m)     Head Circumference --      Peak Flow --      Pain Score 06/24/22 1119 3     Pain Loc --      Pain Edu? --      Excl. in [place]? --     Most recent vital signs: Vitals:   06/24/22 1117 06/24/22 1313  BP: (!) 165/101 (!) 147/95  Pulse: 99 91  Resp: 16   Temp: 97.8 F (36.6 C)   SpO2: 99%     General Awake, no distress.  HEENT NCAT. PERRL. EOMI. No rhinorrhea. Mucous membranes are moist.  CV:  Good peripheral perfusion.  RESP:  Normal effort.  ABD:  No distention.    ED Results / Procedures / Treatments   Labs (all labs ordered are listed, but only abnormal results are displayed) Labs Reviewed  RESP PANEL BY RT-PCR (RSV, FLU A&B, COVID)  RVPGX2 - Abnormal; Notable for the following components:      Result Value   Influenza A by PCR POSITIVE (*)    All other components within normal limits     EKG    RADIOLOGY  I personally viewed and evaluated these images as part of my medical decision making, as well as reviewing the written report by the radiologist.  ED Provider Interpretation: No acute findings  DG Chest 2 View  Result Date:  06/24/2022 CLINICAL DATA:  Cold symptoms for the past several weeks. Chest heaviness. EXAM: CHEST - 2 VIEW COMPARISON:  01/31/2020; 04/29/2019; chest CT-06/267/2017 FINDINGS: Normal cardiac silhouette and mediastinal contours. Punctate granuloma overlies the posterior aspect of the mid lung on the provided lateral radiograph, unchanged. No focal parenchymal opacities. No pleural effusion or pneumothorax. No evidence of edema. No acute osseous abnormalities. IMPRESSION: No acute cardiopulmonary disease. Electronically Signed   By: Sandi Mariscal M.D.   On: 06/24/2022 11:38     PROCEDURES:  Critical Care performed: No  Procedures   MEDICATIONS ORDERED IN ED: Medications - No data to display   IMPRESSION / MDM / [place] / ED COURSE  I reviewed the triage vital signs and the nursing notes.                              Differential diagnosis includes, but is not limited to, COVID, flu, RSV, AOM, CAP, bronchitis, viral URI  Patient's presentation is most consistent  with acute complicated illness / injury requiring diagnostic workup.  Patient to the ED for evaluation of several weeks of cough and congestion.  Patient is evaluated for her complaints in ED, found to have reassuring exam and workup overall.  No signs of any acute respiratory distress.  Patient's viral panel screen did confirm influenza.  Patient will be discharged home with instructions to continue with OTC medications for symptom relief. Patient is to follow up with primary provider as needed or otherwise directed. Patient is given ED precautions to return to the ED for any worsening or new symptoms.  FINAL CLINICAL IMPRESSION(S) / ED DIAGNOSES   Final diagnoses:  Influenza A     Rx / DC Orders   ED Discharge Orders     None        Note:  This document was prepared using Dragon voice recognition software and may include unintentional dictation errors.    Melvenia Needles, PA-C 06/24/22 1402     Melvenia Needles, PA-C 06/24/22 1403    Rada Hay, MD 06/24/22 [phone]

## 2022-06-24 NOTE — Discharge Instructions (Addendum)
Continue with OTC meds for symptom relief. Follow-up with your primary provider or return if needed.

## 2023-03-18 ENCOUNTER — Emergency Department
Admission: EM | Admit: 2023-03-18 | Discharge: 2023-03-18 | Disposition: A | Discharge status: Home / Self Care | Payer: Self-pay | Attending: Student in an Organized Health Care Education/Training Program | Admitting: Student in an Organized Health Care Education/Training Program

## 2023-03-18 ENCOUNTER — Encounter: Payer: Self-pay | Admitting: *Deleted

## 2023-03-18 ENCOUNTER — Emergency Department: Payer: Self-pay

## 2023-03-18 ENCOUNTER — Other Ambulatory Visit: Payer: Self-pay

## 2023-03-18 DIAGNOSIS — U071 COVID-19: Secondary | ICD-10-CM | POA: Insufficient documentation

## 2023-03-18 LAB — CBC
HCT: 33.2 % — ABNORMAL LOW (ref 36.0–46.0)
Hemoglobin: 10.6 g/dL — ABNORMAL LOW (ref 12.0–15.0)
MCH: 21.5 pg — ABNORMAL LOW (ref 26.0–34.0)
MCHC: 31.9 g/dL (ref 30.0–36.0)
MCV: 67.2 fL — ABNORMAL LOW (ref 80.0–100.0)
Platelets: 424 10*3/uL — ABNORMAL HIGH (ref 150–400)
RBC: 4.94 MIL/uL (ref 3.87–5.11)
RDW: 16.7 % — ABNORMAL HIGH (ref 11.5–15.5)
WBC: 8.6 10*3/uL (ref 4.0–10.5)
nRBC: 0 % (ref 0.0–0.2)

## 2023-03-18 LAB — BASIC METABOLIC PANEL
Anion gap: 11 (ref 5–15)
BUN: 15 mg/dL (ref 6–20)
CO2: 26 mmol/L (ref 22–32)
Calcium: 9 mg/dL (ref 8.9–10.3)
Chloride: 102 mmol/L (ref 98–111)
Creatinine, Ser: 0.99 mg/dL (ref 0.44–1.00)
GFR, Estimated: >60 mL/min (ref >60)
Glucose, Bld: 108 mg/dL — ABNORMAL HIGH (ref 70–99)
Potassium: 4.3 mmol/L (ref 3.5–5.1)
Sodium: 139 mmol/L (ref 135–145)

## 2023-03-18 LAB — TROPONIN I (HIGH SENSITIVITY): Troponin I (High Sensitivity): 5 ng/L (ref <18)

## 2023-03-18 MED ORDER — ALBUTEROL SULFATE HFA 108 (90 BASE) MCG/ACT IN AERS: 2.0000 | INHALATION_SPRAY | Freq: Four times a day (QID) | RESPIRATORY_TRACT | 2 refills | Status: DC | PRN

## 2023-03-18 MED ORDER — AZITHROMYCIN 250 MG PO TABS: ORAL_TABLET | ORAL | 0 refills | Status: AC

## 2023-03-18 MED ORDER — ALBUTEROL SULFATE HFA 108 (90 BASE) MCG/ACT IN AERS: 2.0000 | INHALATION_SPRAY | Freq: Four times a day (QID) | RESPIRATORY_TRACT | 0 refills | Status: AC | PRN

## 2023-03-18 MED ORDER — DEXAMETHASONE 4 MG PO TABS
8.0000 mg | ORAL_TABLET | Freq: Once | ORAL | Status: AC
  Administered 2023-03-18: 8 mg via ORAL
  Filled 2023-03-18: qty 2

## 2023-03-18 MED ORDER — AZITHROMYCIN 250 MG PO TABS: ORAL_TABLET | ORAL | 0 refills | Status: DC

## 2023-03-18 NOTE — ED Provider Notes (Signed)
[facility] Provider Note    Event Date/Time   First MD Initiated Contact with Patient 03/18/23 1628     (approximate)   History   Covid Exposure   HPI  Chelsey Mendoza is a 37 y.o. female presents to the ER for evaluation of COVID symptoms.  Tested +2 days ago while at work.  She has had COVID before and is having some chest discomfort similar to when she has had it in the past.  Has had complications related to pneumonia secondary to COVID.  Denies any shortness of breath no pleuritic pain.  No nausea or vomiting.  Has not taken anything for symptoms thus far.     Physical Exam   Triage Vital Signs: ED Triage Vitals  Encounter Vitals Group     BP 03/18/23 1531 (!) 146/98     Systolic BP Percentile --      Diastolic BP Percentile --      Pulse Rate 03/18/23 1531 91     Resp 03/18/23 1531 18     Temp 03/18/23 1531 98.7 F (37.1 C)     Temp Source 03/18/23 1531 Oral     SpO2 03/18/23 1531 98 %     Weight 03/18/23 1532 197 lb (89.4 kg)     Height 03/18/23 1532 5\' 5"  (1.651 m)     Head Circumference --      Peak Flow --      Pain Score 03/18/23 1531 2     Pain Loc --      Pain Education --      Exclude from Growth Chart --     Most recent vital signs: Vitals:   03/18/23 1531  BP: (!) 146/98  Pulse: 91  Resp: 18  Temp: 98.7 F (37.1 C)  SpO2: 98%     Constitutional: Alert  Eyes: Conjunctivae are normal.  Head: Atraumatic. Nose: No congestion/rhinnorhea. Mouth/Throat: Mucous membranes are moist.   Neck: Painless ROM.  Cardiovascular:   Good peripheral circulation. Respiratory: Normal respiratory effort.  No retractions.  Gastrointestinal: Soft and nontender.  Musculoskeletal:  no deformity Neurologic:  MAE spontaneously. No gross focal neurologic deficits are appreciated.  Skin:  Skin is warm, dry and intact. No rash noted. Psychiatric: Mood and affect are normal. Speech and behavior are normal.    ED Results / Procedures /  Treatments   Labs (all labs ordered are listed, but only abnormal results are displayed) Labs Reviewed  BASIC METABOLIC PANEL - Abnormal; Notable for the following components:      Result Value   Glucose, Bld 108 (*)    All other components within normal limits  CBC - Abnormal; Notable for the following components:   Hemoglobin 10.6 (*)    HCT 33.2 (*)    MCV 67.2 (*)    MCH 21.5 (*)    RDW 16.7 (*)    Platelets 424 (*)    All other components within normal limits  TROPONIN I (HIGH SENSITIVITY)     EKG   ED ECG REPORT I, Willy Eddy, the attending physician, personally viewed and interpreted this ECG.   Date: 03/18/2023  EKG Time: 15:38  Rate: 88  Rhythm: sinus  Axis: normal  Intervals: normal  ST&T Change: no stemi, no depressions    RADIOLOGY Please see ED Course for my review and interpretation.  I personally reviewed all radiographic images ordered to evaluate for the above acute complaints and reviewed radiology reports and findings.  These findings  were personally discussed with the patient.  Please see medical record for radiology report.    PROCEDURES:  Critical Care performed: No  Procedures   MEDICATIONS ORDERED IN ED: Medications  dexamethasone (DECADRON) tablet 8 mg (has no administration in time range)     IMPRESSION / MDM / ASSESSMENT AND PLAN / ED COURSE  I reviewed the triage vital signs and the nursing notes.                              Differential diagnosis includes, but is not limited to, pneumonia, bronchitis, COVID, PE, electrolyte abnormality  Patient presenting to the ER for evaluation of symptoms as described above.  Based on symptoms, risk factors and considered above differential, this presenting complaint could reflect a potentially life-threatening illness therefore the patient will be placed on continuous pulse oximetry and telemetry for monitoring.  Laboratory evaluation will be sent to evaluate for the above  complaints.  Patient well-appearing in no acute distress she is afebrile hemodynamically stable she is lowers by Wells criteria is PERC negative.  EKG is nonischemic.  Troponin negative.  Given her previous bouts of pneumonia will give prescription for Z-Pak for its anti-inflammatory effects as well as dose of Decadron and albuterol inhaler.  She does appear appropriate for outpatient follow-up.        FINAL CLINICAL IMPRESSION(S) / ED DIAGNOSES   Final diagnoses:  COVID-19     Rx / DC Orders   ED Discharge Orders          Ordered    albuterol (VENTOLIN HFA) 108 (90 Base) MCG/ACT inhaler  Every 6 hours PRN        03/18/23 1638    albuterol (VENTOLIN HFA) 108 (90 Base) MCG/ACT inhaler  Every 6 hours PRN        03/18/23 1638    azithromycin (ZITHROMAX Z-PAK) 250 MG tablet  Status:  Discontinued        03/18/23 1638    azithromycin (ZITHROMAX Z-PAK) 250 MG tablet        03/18/23 1639             Note:  This document was prepared using Dragon voice recognition software and may include unintentional dictation errors.    Willy Eddy, MD 03/18/23 [phone]

## 2023-03-18 NOTE — ED Triage Notes (Signed)
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  Today, pt has chest pressure and sob.  No fever.  Denies cough  pt alert.

## 2023-10-29 ENCOUNTER — Emergency Department: Payer: Self-pay

## 2023-10-29 ENCOUNTER — Other Ambulatory Visit: Payer: Self-pay

## 2023-10-29 ENCOUNTER — Emergency Department
Admission: EM | Admit: 2023-10-29 | Discharge: 2023-10-29 | Disposition: A | Discharge status: Home / Self Care | Payer: Self-pay | Attending: Emergency Medicine | Admitting: Emergency Medicine

## 2023-10-29 DIAGNOSIS — R519 Headache, unspecified: Secondary | ICD-10-CM | POA: Insufficient documentation

## 2023-10-29 LAB — POC URINE PREG, ED: Preg Test, Ur: NEGATIVE

## 2023-10-29 MED ORDER — BUTALBITAL-APAP-CAFFEINE 50-325-40 MG PO TABS
1.0000 | ORAL_TABLET | Freq: Four times a day (QID) | ORAL | 0 refills | Status: DC | PRN
Start: 2023-10-29 — End: 2024-02-04

## 2023-10-29 MED ORDER — BUTALBITAL-APAP-CAFFEINE 50-325-40 MG PO TABS: 1.0000 | ORAL_TABLET | Freq: Once | ORAL | Status: DC

## 2023-10-29 NOTE — ED Triage Notes (Signed)
 Pt comes with c/o right sided headache that started today. Pt has her Bp checked and it was elevated. Pt denies any meds for BP. Pt states Bp did come down but headache still present.

## 2023-10-29 NOTE — Discharge Instructions (Signed)
 Follow-up with your primary care provider if any continued problems or concerns or Dr. Cullen Dose who is one of the neurologist at [facility] for further evaluation of your headaches.  Your CT scan was reassuring and no bleeding or tumors were noted.  A prescription for headache medication was sent to the pharmacy for you to begin taking.  Do not take this medication and plan on driving or operating machinery as it could cause drowsiness.  Increase fluids to stay hydrated.

## 2023-10-29 NOTE — ED Provider Notes (Signed)
 [facility] Provider Note    Event Date/Time   First MD Initiated Contact with Patient 10/29/23 1306     (approximate)   History   Headache   HPI  Chelsey Mendoza is a 38 y.o. female   presents to the ED with complaint of right-sided headache that began this morning.  Patient denies any previous history of headaches and states that she took Tylenol  and ibuprofen this morning without any relief.  She denies any visual changes, nausea, vomiting, dizziness with her headache.  She reports that she took her blood pressure which initially was elevated but then was normal.  She denies any previous headache history.  Patient history essentially negative with the exception of tobacco use.      Physical Exam   Triage Vital Signs: ED Triage Vitals  Encounter Vitals Group     BP 10/29/23 1202 (!) 155/94     Systolic BP Percentile --      Diastolic BP Percentile --      Pulse Rate 10/29/23 1202 91     Resp 10/29/23 1202 18     Temp 10/29/23 1202 98 F (36.7 C)     Temp src --      SpO2 10/29/23 1202 100 %     Weight 10/29/23 1202 223 lb (101.2 kg)     Height 10/29/23 1202 5\' 5"  (1.651 m)     Head Circumference --      Peak Flow --      Pain Score 10/29/23 1201 6     Pain Loc --      Pain Education --      Exclude from Growth Chart --     Most recent vital signs: Vitals:   10/29/23 1202  BP: (!) 155/94  Pulse: 91  Resp: 18  Temp: 98 F (36.7 C)  SpO2: 100%     General: Awake, no distress.  Alert, talkative, answers questions appropriately.  Mild photosensitivity noted. CV:  Good peripheral perfusion.  Heart rate rate and rhythm. Resp:  Normal effort.  Lungs are clear bilaterally. Abd:  No distention.  Other:  PERRLA, EOMI's, cranial nerves II through XII grossly intact.  Speech is normal.  Good muscle strength bilaterally.  Patient is able to stand and ambulate without any assistance.   ED Results / Procedures / Treatments   Labs (all labs  ordered are listed, but only abnormal results are displayed) Labs Reviewed  POC URINE PREG, ED      RADIOLOGY  CT head per radiology is negative for acute intracranial findings.   PROCEDURES:  Critical Care performed:   Procedures   MEDICATIONS ORDERED IN ED: Medications  butalbital -acetaminophen -caffeine  (FIORICET) 50-325-40 MG per tablet 1 tablet (has no administration in time range)     IMPRESSION / MDM / ASSESSMENT AND PLAN / ED COURSE  I reviewed the triage vital signs and the nursing notes.   Differential diagnosis includes, but is not limited to, migraine headache, muscle contraction headache, sinus headache, seasonal allergies, intracranial changes causing headache.  38 year old female presents to the ED with right-sided headache that began this morning.  No prior history of headaches.  CT scan was reassuring and patient was made aware.  She was given a Fioricet while in the emergency department.  A prescription for the same was sent to her pharmacy.  She is encouraged to drink fluids to stay hydrated.  Also follow-up with her PCP or Dr. Arn Beth who is a neurologist at  [facility] for further evaluation of her headaches should they continue.      Patient's presentation is most consistent with acute complicated illness / injury requiring diagnostic workup.  FINAL CLINICAL IMPRESSION(S) / ED DIAGNOSES   Final diagnoses:  Right-sided headache     Rx / DC Orders   ED Discharge Orders          Ordered    butalbital -acetaminophen -caffeine  (FIORICET) 50-325-40 MG tablet  Every 6 hours PRN        10/29/23 1511             Note:  This document was prepared using Dragon voice recognition software and may include unintentional dictation errors.   Stafford Eagles, PA-C 10/29/23 1519    Kandee Orion, MD 10/29/23 [phone]

## 2023-10-29 NOTE — ED Notes (Signed)
 D/C and new RX discussed with pt, pt verbalized understanding. NAD noted. Pt refused Firocet here and family and pt refused DC vitals "needing to get out of here". NAD noted.

## 2023-10-29 NOTE — ED Notes (Signed)
 Patient transported to CT

## 2023-11-02 ENCOUNTER — Other Ambulatory Visit: Payer: Self-pay

## 2023-11-02 ENCOUNTER — Emergency Department
Admission: EM | Admit: 2023-11-02 | Discharge: 2023-11-02 | Disposition: A | Discharge status: Home / Self Care | Payer: Self-pay | Attending: Emergency Medicine | Admitting: Emergency Medicine

## 2023-11-02 DIAGNOSIS — R519 Headache, unspecified: Secondary | ICD-10-CM

## 2023-11-02 DIAGNOSIS — J020 Streptococcal pharyngitis: Secondary | ICD-10-CM | POA: Insufficient documentation

## 2023-11-02 LAB — GROUP A STREP BY PCR: Group A Strep by PCR: DETECTED — AB

## 2023-11-02 MED ORDER — SODIUM CHLORIDE 0.9 % IV BOLUS
1000.0000 mL | Freq: Once | INTRAVENOUS | Status: AC
  Administered 2023-11-02: 1000 mL via INTRAVENOUS

## 2023-11-02 MED ORDER — KETOROLAC TROMETHAMINE 15 MG/ML IJ SOLN
15.0000 mg | Freq: Once | INTRAMUSCULAR | Status: AC
  Administered 2023-11-02: 15 mg via INTRAVENOUS
  Filled 2023-11-02: qty 1

## 2023-11-02 MED ORDER — AMOXICILLIN 875 MG PO TABS: 875.0000 mg | ORAL_TABLET | Freq: Two times a day (BID) | ORAL | 0 refills | Status: DC

## 2023-11-02 MED ORDER — DEXAMETHASONE SODIUM PHOSPHATE 10 MG/ML IJ SOLN
10.0000 mg | Freq: Once | INTRAMUSCULAR | Status: AC
  Administered 2023-11-02: 10 mg via INTRAVENOUS
  Filled 2023-11-02: qty 1

## 2023-11-02 MED ORDER — DIPHENHYDRAMINE HCL 50 MG/ML IJ SOLN
25.0000 mg | Freq: Once | INTRAMUSCULAR | Status: AC
  Administered 2023-11-02: 25 mg via INTRAVENOUS
  Filled 2023-11-02: qty 1

## 2023-11-02 MED ORDER — METOCLOPRAMIDE HCL 5 MG/ML IJ SOLN
10.0000 mg | Freq: Once | INTRAMUSCULAR | Status: AC
  Administered 2023-11-02: 10 mg via INTRAVENOUS
  Filled 2023-11-02: qty 2

## 2023-11-02 NOTE — ED Notes (Signed)
 See triage notes. Patient c/o headache times five days. No photosensitivity. Patient stated, "I took the medication they gave me and it didn't work."

## 2023-11-02 NOTE — ED Triage Notes (Signed)
 Pt to ED for HA behind R eye since 1 week, not going away. No photosensitivity. Pt is crying. States nauseous since 2 days. States feels pressure to R eye and some blurriness since same time period. Pt also has sore throat since 1 week.

## 2023-11-02 NOTE — Discharge Instructions (Addendum)
 Follow-up with your regular doctor.  Return emergency department if worsening.  Continue your migraine medication but also take the amoxicillin .  You may also take Tylenol  or ibuprofen for pain. For the strep throat, take the antibiotic as prescribed.  Discard your toothbrush in 3 days to not reinfect yourself.  You may gargle with warm salt water.

## 2023-11-02 NOTE — ED Provider Notes (Signed)
 [facility] Provider Note    Event Date/Time   First MD Initiated Contact with Patient 11/02/23 1256     (approximate)   History   Headache   HPI  Chelsey Mendoza is a 38 y.o. female with no significant past medical history presents emergency department with continued right sided headache.  Patient was seen 3 days ago for the same.  States had negative CT.  States she did take the butalbital  that she was given and it did not help.  Some right eye blurriness.  Also had a sore throat.  No fever or chills.        Physical Exam   Triage Vital Signs: ED Triage Vitals  Encounter Vitals Group     BP 11/02/23 1223 (!) 157/100     Systolic BP Percentile --      Diastolic BP Percentile --      Pulse Rate 11/02/23 1223 95     Resp 11/02/23 1223 20     Temp 11/02/23 1223 98.1 F (36.7 C)     Temp Source 11/02/23 1223 Oral     SpO2 11/02/23 1223 100 %     Weight 11/02/23 1224 223 lb (101.2 kg)     Height 11/02/23 1224 5\' 5"  (1.651 m)     Head Circumference --      Peak Flow --      Pain Score 11/02/23 1222 9     Pain Loc --      Pain Education --      Exclude from Growth Chart --     Most recent vital signs: Vitals:   11/02/23 1223  BP: (!) 157/100  Pulse: 95  Resp: 20  Temp: 98.1 F (36.7 C)  SpO2: 100%     General: Awake, no distress.   CV:  Good peripheral perfusion.  Resp:  Normal effort. Abd:  No distention.   Other:  PERRL, EOMI, cranial nerves II through XII grossly intact, no facial droop or slurred speech, C-spine slightly tender, trapezius muscles tender   ED Results / Procedures / Treatments   Labs (all labs ordered are listed, but only abnormal results are displayed) Labs Reviewed  GROUP A STREP BY PCR - Abnormal; Notable for the following components:      Result Value   Group A Strep by PCR DETECTED (*)    All other components within normal limits      EKG     RADIOLOGY     PROCEDURES:   Procedures  Critical Care:  no Chief Complaint  Patient presents with   Headache      MEDICATIONS ORDERED IN ED: Medications  sodium chloride 0.9 % bolus 1,000 mL (0 mLs Intravenous Stopped 11/02/23 1511)  metoCLOPramide (REGLAN) injection 10 mg (10 mg Intravenous Given 11/02/23 1353)  ketorolac (TORADOL) 15 MG/ML injection 15 mg (15 mg Intravenous Given 11/02/23 1352)  dexamethasone  (DECADRON ) injection 10 mg (10 mg Intravenous Given 11/02/23 1351)  diphenhydrAMINE  (BENADRYL ) injection 25 mg (25 mg Intravenous Given 11/02/23 1350)     IMPRESSION / MDM / ASSESSMENT AND PLAN / ED COURSE  I reviewed the triage vital signs and the nursing notes.                              Differential diagnosis includes, but is not limited to, subdural, SAH, optic neuritis, tension headache, cluster headache, viral.  Sinus, strep throat  Patient's presentation is  most consistent with acute illness / injury with system symptoms.   Cardiac monitor no Medications given: Toradol 15 mg IV, Decadron  10 mg IV, Benadryl  25 mg IV, Reglan 10 mg IV and normal saline 1 L IV  Since the patient had a negative CT scan and symptoms have not changed I do not feel like there is a red flag in which you need to do an emergent head CT.  However if she does not improve with medications would have concerns of optic neuritis.  Will order MRI if needed.  Strep test also ordered as patient's been complaining of sore throat  Strep test is positive, will place patient on amoxicillin   Patient had relief with medication.  She was given prescription for amoxicillin .  Follow-up with her regular doctor if not improving in 3 to 4 days.  She is in agreement with treatment plan.  Discharged stable condition.  Strict instructions to return if worsening     FINAL CLINICAL IMPRESSION(S) / ED DIAGNOSES   Final diagnoses:  Bad headache  Acute streptococcal pharyngitis      Rx / DC Orders   ED Discharge Orders          Ordered    amoxicillin  (AMOXIL ) 875 MG tablet  2 times daily        11/02/23 1401             Note:  This document was prepared using Dragon voice recognition software and may include unintentional dictation errors.    Delsie Figures, PA-C 11/02/23 1550    Ruth Cove, MD 11/03/23 [phone]

## 2024-02-02 ENCOUNTER — Other Ambulatory Visit: Payer: Self-pay

## 2024-02-02 ENCOUNTER — Inpatient Hospital Stay
Admission: EM | Admit: 2024-02-02 | Discharge: 2024-02-04 | DRG: 872 | Disposition: A | Discharge status: Home / Self Care | Payer: MEDICAID | Attending: Internal Medicine | Admitting: Internal Medicine

## 2024-02-02 ENCOUNTER — Emergency Department: Payer: Self-pay

## 2024-02-02 ENCOUNTER — Encounter: Payer: Self-pay | Admitting: Emergency Medicine

## 2024-02-02 DIAGNOSIS — F17210 Nicotine dependence, cigarettes, uncomplicated: Secondary | ICD-10-CM | POA: Diagnosis present

## 2024-02-02 DIAGNOSIS — E538 Deficiency of other specified B group vitamins: Secondary | ICD-10-CM | POA: Diagnosis present

## 2024-02-02 DIAGNOSIS — Z79899 Other long term (current) drug therapy: Secondary | ICD-10-CM

## 2024-02-02 DIAGNOSIS — N12 Tubulo-interstitial nephritis, not specified as acute or chronic: Secondary | ICD-10-CM | POA: Diagnosis present

## 2024-02-02 DIAGNOSIS — B9620 Unspecified Escherichia coli [E. coli] as the cause of diseases classified elsewhere: Secondary | ICD-10-CM | POA: Diagnosis present

## 2024-02-02 DIAGNOSIS — R3129 Other microscopic hematuria: Secondary | ICD-10-CM | POA: Diagnosis present

## 2024-02-02 DIAGNOSIS — D509 Iron deficiency anemia, unspecified: Secondary | ICD-10-CM | POA: Diagnosis present

## 2024-02-02 DIAGNOSIS — Z8616 Personal history of COVID-19: Secondary | ICD-10-CM

## 2024-02-02 DIAGNOSIS — A419 Sepsis, unspecified organism: Principal | ICD-10-CM | POA: Diagnosis present

## 2024-02-02 DIAGNOSIS — E876 Hypokalemia: Secondary | ICD-10-CM | POA: Diagnosis present

## 2024-02-02 DIAGNOSIS — E860 Dehydration: Secondary | ICD-10-CM | POA: Diagnosis present

## 2024-02-02 DIAGNOSIS — Z1152 Encounter for screening for COVID-19: Secondary | ICD-10-CM

## 2024-02-02 LAB — RESPIRATORY PANEL BY PCR

## 2024-02-02 LAB — COMPREHENSIVE METABOLIC PANEL WITH GFR
ALT: 12 U/L (ref 0–44)
AST: 36 U/L (ref 15–41)
Albumin: 3.5 g/dL (ref 3.5–5.0)
Alkaline Phosphatase: 54 U/L (ref 38–126)
Anion gap: 9 (ref 5–15)
BUN: 9 mg/dL (ref 6–20)
CO2: 28 mmol/L (ref 22–32)
Calcium: 8.5 mg/dL — ABNORMAL LOW (ref 8.9–10.3)
Chloride: 98 mmol/L (ref 98–111)
Creatinine, Ser: 1.08 mg/dL — ABNORMAL HIGH (ref 0.44–1.00)
GFR, Estimated: >60 mL/min (ref >60)
Glucose, Bld: 116 mg/dL — ABNORMAL HIGH (ref 70–99)
Potassium: 3.9 mmol/L (ref 3.5–5.1)
Sodium: 135 mmol/L (ref 135–145)
Total Bilirubin: 1.8 mg/dL — ABNORMAL HIGH (ref 0.0–1.2)
Total Protein: 8.3 g/dL — ABNORMAL HIGH (ref 6.5–8.1)

## 2024-02-02 LAB — CBC WITH DIFFERENTIAL/PLATELET
Abs Immature Granulocytes: 0.09 K/uL — ABNORMAL HIGH (ref 0.00–0.07)
Basophils Absolute: 0 K/uL (ref 0.0–0.1)
Basophils Relative: 0 %
Eosinophils Absolute: 0 K/uL (ref 0.0–0.5)
Eosinophils Relative: 0 %
HCT: 33.4 % — ABNORMAL LOW (ref 36.0–46.0)
Hemoglobin: 10.8 g/dL — ABNORMAL LOW (ref 12.0–15.0)
Immature Granulocytes: 1 %
Lymphocytes Relative: 13 %
Lymphs Abs: 2.2 K/uL (ref 0.7–4.0)
MCH: 21.8 pg — ABNORMAL LOW (ref 26.0–34.0)
MCHC: 32.3 g/dL (ref 30.0–36.0)
MCV: 67.3 fL — ABNORMAL LOW (ref 80.0–100.0)
Monocytes Absolute: 2.5 K/uL — ABNORMAL HIGH (ref 0.1–1.0)
Monocytes Relative: 15 %
Neutro Abs: 11.9 K/uL — ABNORMAL HIGH (ref 1.7–7.7)
Neutrophils Relative %: 71 %
Platelets: 356 K/uL (ref 150–400)
RBC: 4.96 MIL/uL (ref 3.87–5.11)
RDW: 15.8 % — ABNORMAL HIGH (ref 11.5–15.5)
Smear Review: NORMAL
WBC: 16.7 K/uL — ABNORMAL HIGH (ref 4.0–10.5)
nRBC: 0 % (ref 0.0–0.2)

## 2024-02-02 LAB — IRON AND TIBC
Iron: 8 ug/dL — ABNORMAL LOW (ref 28–170)
Saturation Ratios: 2 % — ABNORMAL LOW (ref 10.4–31.8)
TIBC: 358 ug/dL (ref 250–450)
UIBC: 350 ug/dL

## 2024-02-02 LAB — HIV ANTIBODY (ROUTINE TESTING W REFLEX): HIV Screen 4th Generation wRfx: NONREACTIVE

## 2024-02-02 LAB — URINALYSIS, ROUTINE W REFLEX MICROSCOPIC
Bilirubin Urine: NEGATIVE
Glucose, UA: NEGATIVE mg/dL
Ketones, ur: 5 mg/dL — AB
Nitrite: NEGATIVE
Protein, ur: >=300 mg/dL — AB
Specific Gravity, Urine: 1.018 (ref 1.005–1.030)
WBC, UA: >50 WBC/hpf (ref 0–5)
pH: 5 (ref 5.0–8.0)

## 2024-02-02 LAB — POC URINE PREG, ED: Preg Test, Ur: NEGATIVE

## 2024-02-02 LAB — RETICULOCYTES
Immature Retic Fract: 9.4 % (ref 2.3–15.9)
RBC.: 4.51 MIL/uL (ref 3.87–5.11)
Retic Count, Absolute: 45.1 K/uL (ref 19.0–186.0)
Retic Ct Pct: 1 % (ref 0.4–3.1)

## 2024-02-02 LAB — LACTIC ACID, PLASMA
Lactic Acid, Venous: 1.3 mmol/L (ref 0.5–1.9)
Lactic Acid, Venous: 1 mmol/L (ref 0.5–1.9)

## 2024-02-02 LAB — RESP PANEL BY RT-PCR (RSV, FLU A&B, COVID)  RVPGX2
Influenza A by PCR: NEGATIVE
Influenza B by PCR: NEGATIVE
Resp Syncytial Virus by PCR: NEGATIVE
SARS Coronavirus 2 by RT PCR: NEGATIVE

## 2024-02-02 LAB — FOLATE: Folate: 15.7 ng/mL (ref >5.9)

## 2024-02-02 LAB — VITAMIN B12: Vitamin B-12: 142 pg/mL — ABNORMAL LOW (ref 180–914)

## 2024-02-02 LAB — FERRITIN: Ferritin: 108 ng/mL (ref 11–307)

## 2024-02-02 MED ORDER — ACETAMINOPHEN 650 MG RE SUPP
650.0000 mg | Freq: Four times a day (QID) | RECTAL | Status: DC | PRN
Start: 2024-02-02 — End: 2024-02-04

## 2024-02-02 MED ORDER — POLYETHYLENE GLYCOL 3350 17 G PO PACK
17.0000 g | PACK | Freq: Every day | ORAL | Status: DC | PRN
Start: 2024-02-02 — End: 2024-02-04

## 2024-02-02 MED ORDER — ENOXAPARIN SODIUM 60 MG/0.6ML IJ SOSY
45.0000 mg | PREFILLED_SYRINGE | INTRAMUSCULAR | Status: DC
  Administered 2024-02-02 – 2024-02-03 (×2): 45 mg via SUBCUTANEOUS
  Filled 2024-02-02 (×2): qty 0.6

## 2024-02-02 MED ORDER — SODIUM CHLORIDE 0.9 % IV SOLN
2.0000 g | INTRAVENOUS | Status: DC
  Administered 2024-02-03 – 2024-02-04 (×2): 2 g via INTRAVENOUS
  Filled 2024-02-02 (×2): qty 20

## 2024-02-02 MED ORDER — ACETAMINOPHEN 325 MG PO TABS
650.0000 mg | ORAL_TABLET | Freq: Four times a day (QID) | ORAL | Status: DC | PRN
Start: 2024-02-02 — End: 2024-02-04
  Administered 2024-02-03 (×2): 650 mg via ORAL
  Filled 2024-02-02 (×2): qty 2

## 2024-02-02 MED ORDER — DM-GUAIFENESIN ER 30-600 MG PO TB12
1.0000 | ORAL_TABLET | Freq: Two times a day (BID) | ORAL | Status: DC
  Administered 2024-02-02 – 2024-02-04 (×4): 1 via ORAL
  Filled 2024-02-02 (×4): qty 1

## 2024-02-02 MED ORDER — SODIUM CHLORIDE 0.9 % IV SOLN
1.0000 g | Freq: Once | INTRAVENOUS | Status: AC
  Administered 2024-02-02: 1 g via INTRAVENOUS
  Filled 2024-02-02: qty 10

## 2024-02-02 MED ORDER — LORATADINE 10 MG PO TABS
10.0000 mg | ORAL_TABLET | Freq: Every day | ORAL | Status: DC
  Administered 2024-02-02 – 2024-02-04 (×3): 10 mg via ORAL
  Filled 2024-02-02 (×3): qty 1

## 2024-02-02 MED ORDER — LACTATED RINGERS IV BOLUS
1000.0000 mL | Freq: Once | INTRAVENOUS | Status: AC
  Administered 2024-02-02: 1000 mL via INTRAVENOUS

## 2024-02-02 MED ORDER — ACETAMINOPHEN 325 MG PO TABS
650.0000 mg | ORAL_TABLET | Freq: Once | ORAL | Status: AC | PRN
  Administered 2024-02-02: 650 mg via ORAL
  Filled 2024-02-02: qty 2

## 2024-02-02 MED ORDER — ONDANSETRON HCL 4 MG/2ML IJ SOLN
4.0000 mg | Freq: Once | INTRAMUSCULAR | Status: AC
  Administered 2024-02-02: 4 mg via INTRAVENOUS
  Filled 2024-02-02: qty 2

## 2024-02-02 MED ORDER — LACTATED RINGERS IV SOLN: INTRAVENOUS | Status: AC

## 2024-02-02 MED ORDER — SODIUM CHLORIDE 0.9 % IV BOLUS
1000.0000 mL | Freq: Once | INTRAVENOUS | Status: AC
  Administered 2024-02-02: 1000 mL via INTRAVENOUS

## 2024-02-02 MED ORDER — IOHEXOL 300 MG/ML  SOLN
100.0000 mL | Freq: Once | INTRAMUSCULAR | Status: AC | PRN
  Administered 2024-02-02: 100 mL via INTRAVENOUS

## 2024-02-02 NOTE — ED Triage Notes (Signed)
 Patient to ED via Pov for cough, chills and body aches x4 days. States fever at home but does not have a thermometer to check.

## 2024-02-02 NOTE — Assessment & Plan Note (Signed)
 Patient met sepsis criteria with fever, tachycardia and leukocytosis.  Lactic acid was not checked.  Mildly increased creatinine and T. bili likely due to dehydration but did not meet the criteria for AKI.  Patient has upper respiratory symptoms, and left-sided abdominal pain and CT abdomen concerning for left pyelonephritis.  Respiratory panel was negative for COVID, influenza and RSV.  Chest x-ray was negative.  -Admit to MedSurg - Ordered respiratory viral panel - Ordered blood and urine culture -Ordered lactic acid -Ordered some IV fluid - Ceftriaxone  for UTI - Supportive care for upper respiratory symptoms

## 2024-02-02 NOTE — ED Notes (Signed)
 Lab called for blood cultures.

## 2024-02-02 NOTE — Assessment & Plan Note (Signed)
 Hemoglobin with further decreased to 8.8, no current obvious bleeding but patient just finished her cycle and she had history of significant menorrhagia.   Anemia panel with B12 and iron  deficiency -Ordered 1 dose of IV iron  followed by p.o. supplement -Ordered some IM B12 supplement followed by p.o. -Discussed with patient that she need to follow-up with her gynecologist

## 2024-02-02 NOTE — H&P (Signed)
 History and Physical    PatientAmberleigh Mendoza FMW:[id number] DOB: 1985/09/27 DOA: 02/02/2024 DOS: the patient was seen and examined on 02/02/2024 PCP: Pcp, No  Patient coming from: Home  Chief Complaint:  Chief Complaint  Patient presents with   Cough   HPI: Chelsey Mendoza is a 38 y.o. female with no significant medical history significant presented with generalized aches and pain, fever, decreased appetite and poor p.o. intake for the past 4 days.  Patient was also having cough and nasal congestion for the past couple of days.  She had some nausea and had 1 vomitus.  No diarrhea.  She was complaining of left-sided abdominal pain.  No dysuria, do endorse decreased urinary output and a strong smell.  ED course and data reviewed.  On presentation patient was febrile at 100.6, tachycardic at 126, rest of the vital stable and on room air.  Labs with neutrophilic predominant leukocytosis at 16.7, hemoglobin of 10.8 which seems to be around her baseline, creatinine at 1.08, T. bili of 1.8, UA with microscopic hematuria, mild ketonuria, significant protein urea, small leukocytes and many bacteria.  Respiratory panel negative for COVID, RSV and influenza.  CXR negative for any acute abnormality.  CT abdomen and pelvis with concern of left pyelonephritis.  Patient was given 1 bolus of normal saline and ceftriaxone . She was admitted for pyelonephritis.  Review of Systems: As mentioned in the history of present illness. All other systems reviewed and are negative. History reviewed. No pertinent past medical history. Past Surgical History:  Procedure Laterality Date   SKIN GRAFT     TUBAL LIGATION     Social History:  reports that she has been smoking cigarettes. She has never used smokeless tobacco. She reports that she does not currently use alcohol. She reports current drug use. Drug: Marijuana.  No Known Allergies  History reviewed. No pertinent family history.  Prior to Admission medications    Medication Sig Start Date End Date Taking? Authorizing Provider  albuterol  (VENTOLIN  HFA) 108 (90 Base) MCG/ACT inhaler Inhale 2 puffs into the lungs every 6 (six) hours as needed for wheezing or shortness of breath. 03/18/23   Lang Dover, MD  albuterol  (VENTOLIN  HFA) 108 [phone] Base) MCG/ACT inhaler Inhale 2 puffs into the lungs every 6 (six) hours as needed for wheezing or shortness of breath. 03/18/23   Lang Dover, MD  amoxicillin  (AMOXIL ) 875 MG tablet Take 1 tablet (875 mg total) by mouth 2 (two) times daily. 11/02/23   Gasper Devere ORN, PA-C  butalbital -acetaminophen -caffeine  (FIORICET) 50-325-40 MG tablet Take 1 tablet by mouth every 6 (six) hours as needed for headache. 10/29/23 10/28/24  Saunders Shona CROME, PA-C  carbamazepine  (TEGRETOL -XR) 100 MG 12 hr tablet Take 1 tablet (100 mg total) by mouth 2 (two) times daily. 07/01/16 07/08/16  Lang Dover, MD  ibuprofen (ADVIL,MOTRIN) 200 MG tablet Take 400 mg by mouth every 6 (six) hours as needed.    [provider]  oxymetazoline  (AFRIN) 0.05 % nasal spray Place 2 sprays into both nostrils 2 (two) times daily. 04/28/15   Trudy Dorn BRAVO, MD    Physical Exam: Vitals:   02/02/24 1510 02/02/24 1630 02/02/24 1735 02/02/24 1739  BP: 137/88 (!) 140/91 (!) 153/92   Pulse: 100 85 92   Resp: 18  16   Temp:    98.4 F (36.9 C)  TempSrc:      SpO2: 98% 100% 100%   Weight:      Height:  General: Vital signs reviewed.  Patient is well-developed and well-nourished, in no acute distress and cooperative with exam.  Head: Normocephalic and atraumatic. Eyes: EOMI, conjunctivae normal, no scleral icterus.  Neck: Supple, trachea midline, normal ROM, no JVD,  Cardiovascular: RRR, S1 normal, S2 normal, no murmurs, gallops, or rubs. Pulmonary/Chest: Clear to auscultation bilaterally, no wheezes, rales, or rhonchi. Abdominal: Soft, non-tender, non-distended, BS +, Extremities: No lower extremity edema bilaterally,  pulses  symmetric and intact bilaterally. No cyanosis or clubbing. Neurological: A&O x3, Strength is normal and symmetric bilaterally, cranial nerve II-XII are grossly intact, no focal motor deficit, sensory intact to light touch bilaterally.  Skin: Warm, dry and intact. No rashes or erythema. Psychiatric: Normal mood and affect.    Data Reviewed: Prior data reviewed as mentioned above  Assessment and Plan: * Pyelonephritis Patient met sepsis criteria with fever, tachycardia and leukocytosis.  Lactic acid was not checked.  Mildly increased creatinine and T. bili likely due to dehydration but did not meet the criteria for AKI.  Patient has upper respiratory symptoms, and left-sided abdominal pain and CT abdomen concerning for left pyelonephritis.  Respiratory panel was negative for COVID, influenza and RSV.  Chest x-ray was negative.  -Admit to MedSurg - Ordered respiratory viral panel - Ordered blood and urine culture -Ordered lactic acid -Ordered some IV fluid - Ceftriaxone  for UTI - Supportive care for upper respiratory symptoms   Microcytic anemia Hemoglobin at 10.8 with microcytosis which seems to be around baseline.   Patient to have some menorrhagia. - Check anemia panel    Advance Care Planning:   Code Status: Full Code   Consults: None  Family Communication: Discussed with patient  Severity of Illness: The appropriate patient status for this patient is INPATIENT. Inpatient status is judged to be reasonable and necessary in order to provide the required intensity of service to ensure the patient's safety. The patient's presenting symptoms, physical exam findings, and initial radiographic and laboratory data in the context of their chronic comorbidities is felt to place them at high risk for further clinical deterioration. Furthermore, it is not anticipated that the patient will be medically stable for discharge from the hospital within 2 midnights of admission.   * I certify that  at the point of admission it is my clinical judgment that the patient will require inpatient hospital care spanning beyond 2 midnights from the point of admission due to high intensity of service, high risk for further deterioration and high frequency of surveillance required.*  This record has been created using Conservation officer, historic buildings. Errors have been sought and corrected,but may not always be located. Such creation errors do not reflect on the standard of care.   Author: Amaryllis Dare, MD 02/02/2024 6:02 PM  For on call review [email or web address].

## 2024-02-02 NOTE — ED Provider Notes (Signed)
 [facility] Provider Note    Event Date/Time   First MD Initiated Contact with Patient 02/02/24 1430     (approximate)   History   Cough   HPI  Chelsey Mendoza is a 38 y.o. female no significant past medical history presents emergency department with fever, chills, body aches, cough.  Is concerned she might have COVID this is bad as she feels is what she felt like when she had COVID.  No dysuria.  No vomiting or diarrhea.  Symptoms have been ongoing for 4 days      Physical Exam   Triage Vital Signs: ED Triage Vitals  Encounter Vitals Group     BP 02/02/24 1321 (!) 153/110     Girls Systolic BP Percentile --      Girls Diastolic BP Percentile --      Boys Systolic BP Percentile --      Boys Diastolic BP Percentile --      Pulse Rate 02/02/24 1321 (!) 126     Resp 02/02/24 1321 18     Temp 02/02/24 1321 (!) 100.6 F (38.1 C)     Temp Source 02/02/24 1321 Oral     SpO2 02/02/24 1321 99 %     Weight 02/02/24 1320 213 lb (96.6 kg)     Height 02/02/24 1320 5' 5 (1.651 m)     Head Circumference --      Peak Flow --      Pain Score 02/02/24 1321 3     Pain Loc --      Pain Education --      Exclude from Growth Chart --     Most recent vital signs: Vitals:   02/02/24 1735 02/02/24 1739  BP: (!) 153/92   Pulse: 92   Resp: 16   Temp:  98.4 F (36.9 C)  SpO2: 100%      General: Awake, no distress.  Patient appears to be febrile, sweaty CV:  Good peripheral perfusion Resp:  Normal effort.  Lungs CTA Abd:  No distention.  Mild CVA tenderness on the left Other:      ED Results / Procedures / Treatments   Labs (all labs ordered are listed, but only abnormal results are displayed) Labs Reviewed  URINALYSIS, ROUTINE W REFLEX MICROSCOPIC - Abnormal; Notable for the following components:      Result Value   Color, Urine AMBER (*)    APPearance CLOUDY (*)    Hgb urine dipstick MODERATE (*)    Ketones, ur 5 (*)    Protein, ur >=300 (*)     Leukocytes,Ua SMALL (*)    Bacteria, UA MANY (*)    All other components within normal limits  COMPREHENSIVE METABOLIC PANEL WITH GFR - Abnormal; Notable for the following components:   Glucose, Bld 116 (*)    Creatinine, Ser 1.08 (*)    Calcium 8.5 (*)    Total Protein 8.3 (*)    Total Bilirubin 1.8 (*)    All other components within normal limits  CBC WITH DIFFERENTIAL/PLATELET - Abnormal; Notable for the following components:   WBC 16.7 (*)    Hemoglobin 10.8 (*)    HCT 33.4 (*)    MCV 67.3 (*)    MCH 21.8 (*)    RDW 15.8 (*)    Neutro Abs 11.9 (*)    Monocytes Absolute 2.5 (*)    Abs Immature Granulocytes 0.09 (*)    All other components within normal limits  POC  URINE PREG, ED - Normal  RESP PANEL BY RT-PCR (RSV, FLU A&B, COVID)  RVPGX2  URINE CULTURE  CULTURE, BLOOD (ROUTINE X 2)  CULTURE, BLOOD (ROUTINE X 2)  RESPIRATORY PANEL BY PCR  HIV ANTIBODY (ROUTINE TESTING W REFLEX)  LACTIC ACID, PLASMA  LACTIC ACID, PLASMA  VITAMIN B12  FOLATE  IRON  AND TIBC  FERRITIN  RETICULOCYTES  CBC  COMPREHENSIVE METABOLIC PANEL WITH GFR     EKG     RADIOLOGY CT abdomen pelvis IV contrast    PROCEDURES:   Procedures  Critical Care:  no Chief Complaint  Patient presents with   Cough      MEDICATIONS ORDERED IN ED: Medications  enoxaparin  (LOVENOX ) injection 45 mg (has no administration in time range)  lactated ringers  infusion (has no administration in time range)  acetaminophen  (TYLENOL ) tablet 650 mg (has no administration in time range)    Or  acetaminophen  (TYLENOL ) suppository 650 mg (has no administration in time range)  polyethylene glycol (MIRALAX  / GLYCOLAX ) packet 17 g (has no administration in time range)  dextromethorphan -guaiFENesin  (MUCINEX  DM) 30-600 MG per 12 hr tablet 1 tablet (has no administration in time range)  cefTRIAXone  (ROCEPHIN ) 2 g in sodium chloride  0.9 % 100 mL IVPB (has no administration in time range)  loratadine  (CLARITIN )  tablet 10 mg (has no administration in time range)  acetaminophen  (TYLENOL ) tablet 650 mg (650 mg Oral Given 02/02/24 1323)  sodium chloride  0.9 % bolus 1,000 mL (1,000 mLs Intravenous New Bag/Given 02/02/24 1538)  ondansetron  (ZOFRAN ) injection 4 mg (4 mg Intravenous Given 02/02/24 1538)  cefTRIAXone  (ROCEPHIN ) 1 g in sodium chloride  0.9 % 100 mL IVPB (0 g Intravenous Stopped 02/02/24 1715)  iohexol  (OMNIPAQUE ) 300 MG/ML solution 100 mL (100 mLs Intravenous Contrast Given 02/02/24 1652)  lactated ringers  bolus 1,000 mL (1,000 mLs Intravenous New Bag/Given 02/02/24 1800)     IMPRESSION / MDM / ASSESSMENT AND PLAN / ED COURSE  I reviewed the triage vital signs and the nursing notes.                              Differential diagnosis includes, but is not limited to, COVID, influenza, CAP, UTI, pyelonephritis, acute cholecystitis, sepsis  Patient's presentation is most consistent with acute illness / injury with system symptoms.   Patient does meet sepsis criteria, Medications given: Tylenol  given in triage, normal saline 1 L IV, Zofran  4 mg IV and Rocephin  1 g IV given for the UTI/pyelonephritis  Comprehensive metabolic panel shows elevated creatinine of 1.08 indicating dehydration, total bili increased at 1.8 which would give concerns of gallbladder disease.  UA shows a moderate amount of hemoglobin, greater than 300 protein, small amount of leuks, many bacteria, greater than 50 WBCs, POC pregnancy negative  Respiratory panel reassuring  Chest x-ray independently reviewed interpreted by me as being negative for any acute abnormality    Due to the infection in the urine we will go ahead and give Rocephin  1 g IV  CT abdomen pelvis IV contrast for pyelonephritis versus infected kidney stone was independently reviewed interpreted by me as being positive for left-sided pyelonephritis  Patient does meet sepsis criteria as she was febrile, tachycardic on arrival along with UTI and  pyelonephritis.  Therefore we will contact hospitalist for admission.  Patient would like to be admitted to the hospital.   Consult hospitalist.  Hospitalist is agreeable for admission for acute sepsis and pyelonephritis  FINAL CLINICAL  IMPRESSION(S) / ED DIAGNOSES   Final diagnoses:  Acute sepsis (HCC)  Pyelonephritis     Rx / DC Orders   ED Discharge Orders     None        Note:  This document was prepared using Dragon voice recognition software and may include unintentional dictation errors.    Gasper Devere ORN, PA-C 02/02/24 1815    Clarine Ozell LABOR, MD 02/04/24 [phone]

## 2024-02-03 DIAGNOSIS — E876 Hypokalemia: Secondary | ICD-10-CM

## 2024-02-03 LAB — CBC
HCT: 27.7 % — ABNORMAL LOW (ref 36.0–46.0)
Hemoglobin: 8.8 g/dL — ABNORMAL LOW (ref 12.0–15.0)
MCH: 21.7 pg — ABNORMAL LOW (ref 26.0–34.0)
MCHC: 31.8 g/dL (ref 30.0–36.0)
MCV: 68.4 fL — ABNORMAL LOW (ref 80.0–100.0)
Platelets: 312 K/uL (ref 150–400)
RBC: 4.05 MIL/uL (ref 3.87–5.11)
RDW: 15.8 % — ABNORMAL HIGH (ref 11.5–15.5)
WBC: 15.8 K/uL — ABNORMAL HIGH (ref 4.0–10.5)
nRBC: 0 % (ref 0.0–0.2)

## 2024-02-03 LAB — COMPREHENSIVE METABOLIC PANEL WITH GFR
ALT: 12 U/L (ref 0–44)
AST: 19 U/L (ref 15–41)
Albumin: 2.7 g/dL — ABNORMAL LOW (ref 3.5–5.0)
Alkaline Phosphatase: 51 U/L (ref 38–126)
Anion gap: 13 (ref 5–15)
BUN: 6 mg/dL (ref 6–20)
CO2: 23 mmol/L (ref 22–32)
Calcium: 7.9 mg/dL — ABNORMAL LOW (ref 8.9–10.3)
Chloride: 99 mmol/L (ref 98–111)
Creatinine, Ser: 0.76 mg/dL (ref 0.44–1.00)
GFR, Estimated: >60 mL/min (ref >60)
Glucose, Bld: 101 mg/dL — ABNORMAL HIGH (ref 70–99)
Potassium: 2.5 mmol/L — CL (ref 3.5–5.1)
Sodium: 135 mmol/L (ref 135–145)
Total Bilirubin: 0.5 mg/dL (ref 0.0–1.2)
Total Protein: 6.9 g/dL (ref 6.5–8.1)

## 2024-02-03 LAB — PHOSPHORUS: Phosphorus: 2.3 mg/dL — ABNORMAL LOW (ref 2.5–4.6)

## 2024-02-03 LAB — POTASSIUM: Potassium: 3.5 mmol/L (ref 3.5–5.1)

## 2024-02-03 LAB — MAGNESIUM: Magnesium: 2.1 mg/dL (ref 1.7–2.4)

## 2024-02-03 MED ORDER — CYANOCOBALAMIN 1000 MCG/ML IJ SOLN
1000.0000 ug | Freq: Every day | INTRAMUSCULAR | Status: DC
  Administered 2024-02-03 – 2024-02-04 (×2): 1000 ug via INTRAMUSCULAR
  Filled 2024-02-03 (×2): qty 1

## 2024-02-03 MED ORDER — VITAMIN B-12 1000 MCG PO TABS
1000.0000 ug | ORAL_TABLET | Freq: Every day | ORAL | Status: DC
  Administered 2024-02-04: 1000 ug via ORAL
  Filled 2024-02-03: qty 1

## 2024-02-03 MED ORDER — POTASSIUM CHLORIDE CRYS ER 20 MEQ PO TBCR
40.0000 meq | EXTENDED_RELEASE_TABLET | ORAL | Status: AC
  Administered 2024-02-03 (×3): 40 meq via ORAL
  Filled 2024-02-03 (×3): qty 2

## 2024-02-03 MED ORDER — IRON SUCROSE 200 MG IVPB - SIMPLE MED
200.0000 mg | Freq: Once | Status: AC
  Administered 2024-02-03: 200 mg via INTRAVENOUS
  Filled 2024-02-03: qty 200

## 2024-02-03 MED ORDER — POTASSIUM PHOSPHATES 15 MMOLE/5ML IV SOLN
30.0000 mmol | Freq: Once | INTRAVENOUS | Status: AC
  Administered 2024-02-03: 30 mmol via INTRAVENOUS
  Filled 2024-02-03 (×2): qty 10

## 2024-02-03 MED ORDER — FERROUS SULFATE 325 (65 FE) MG PO TABS
325.0000 mg | ORAL_TABLET | Freq: Two times a day (BID) | ORAL | Status: DC
  Administered 2024-02-04: 325 mg via ORAL
  Filled 2024-02-03: qty 1

## 2024-02-03 NOTE — Assessment & Plan Note (Signed)
 Significant hypokalemia with potassium at 2.5 today.  No obvious GI losses.  Magnesium normal -Replace potassium and monitor

## 2024-02-03 NOTE — Hospital Course (Addendum)
 Chelsey Mendoza is a 38 y.o. female with no significant medical history significant presented with generalized aches and pain, fever, decreased appetite and poor p.o. intake for the past 4 days.  Patient was also having cough and nasal congestion for the past couple of days.  She had some nausea and had 1 vomitus.  No diarrhea.  She was complaining of left-sided abdominal pain.  No dysuria, do endorse decreased urinary output and a strong smell.   On presentation patient was febrile at 100.6, tachycardic at 126, rest of the vital stable and on room air.  Labs with neutrophilic predominant leukocytosis at 16.7, hemoglobin of 10.8 which seems to be around her baseline, creatinine at 1.08, T. bili of 1.8, UA with microscopic hematuria, mild ketonuria, significant protein urea, small leukocytes and many bacteria.  Respiratory panel negative for COVID, RSV and influenza.  CXR negative for any acute abnormality.  CT abdomen and pelvis with concern of left pyelonephritis.   Patient was given 1 bolus of normal saline and ceftriaxone . She was admitted for pyelonephritis.  8/21: Afebrile with stable vitals, maximum temperature recorded of 102 over the past 24 hours.  Small improvement in leukocytosis to 15.8 but hemoglobin decreased to 8.8-also all cell lines decreased so likely some dilutional effect.  Significant hypokalemia with potassium at 2.5, creatinine and T. bili improved.  RVP negative. Anemia panel with iron  and B12 deficiency-started on supplement. Preliminary blood cultures negative and urine cultures pending.  8/22: Remained hemodynamically stable and afebrile.  Urine cultures with E. coli which showed resistance only to Augmentin  and ampicillin.  Patient received ceftriaxone  while in the hospital and is being discharged on ciprofloxacin  to complete the course.  Patient was also started on iron  and B12 supplement and instructed to follow-up with her gynecologist for concern of significant menorrhagia  causing anemia.  She will continue on current medications and need to have a follow-up with her providers for further assistance.

## 2024-02-03 NOTE — Plan of Care (Signed)
  Problem: Activity: Goal: Risk for activity intolerance will decrease Outcome: Progressing   Problem: Nutrition: Goal: Adequate nutrition will be maintained Outcome: Progressing   Problem: Coping: Goal: Level of anxiety will decrease Outcome: Progressing   Problem: Pain Managment: Goal: General experience of comfort will improve and/or be controlled Outcome: Progressing   Problem: Safety: Goal: Ability to remain free from injury will improve Outcome: Progressing   Problem: Skin Integrity: Goal: Risk for impaired skin integrity will decrease Outcome: Progressing   Problem: Clinical Measurements: Goal: Diagnostic test results will improve Outcome: Not Progressing

## 2024-02-03 NOTE — Plan of Care (Signed)

## 2024-02-03 NOTE — Assessment & Plan Note (Signed)
 Mild hypophosphatemia with phosphorus at 2.3 -Replace phosphorus and monitor

## 2024-02-03 NOTE — Progress Notes (Signed)
 Progress Note   Patient: Chelsey Mendoza FMW:[id number] DOB: 21-Feb-1986 DOA: 02/02/2024     1 DOS: the patient was seen and examined on 02/03/2024   Brief hospital course: Aliah Eriksson is a 38 y.o. female with no significant medical history significant presented with generalized aches and pain, fever, decreased appetite and poor p.o. intake for the past 4 days.  Patient was also having cough and nasal congestion for the past couple of days.  She had some nausea and had 1 vomitus.  No diarrhea.  She was complaining of left-sided abdominal pain.  No dysuria, do endorse decreased urinary output and a strong smell.   On presentation patient was febrile at 100.6, tachycardic at 126, rest of the vital stable and on room air.  Labs with neutrophilic predominant leukocytosis at 16.7, hemoglobin of 10.8 which seems to be around her baseline, creatinine at 1.08, T. bili of 1.8, UA with microscopic hematuria, mild ketonuria, significant protein urea, small leukocytes and many bacteria.  Respiratory panel negative for COVID, RSV and influenza.  CXR negative for any acute abnormality.  CT abdomen and pelvis with concern of left pyelonephritis.   Patient was given 1 bolus of normal saline and ceftriaxone . She was admitted for pyelonephritis.  8/21: Afebrile with stable vitals, maximum temperature recorded of 102 over the past 24 hours.  Small improvement in leukocytosis to 15.8 but hemoglobin decreased to 8.8-also all cell lines decreased so likely some dilutional effect.  Significant hypokalemia with potassium at 2.5, creatinine and T. bili improved.  RVP negative. Anemia panel with iron  and B12 deficiency-started on supplement. Preliminary blood cultures negative and urine cultures pending.  Assessment and Plan: * Pyelonephritis Patient met sepsis criteria with fever, tachycardia and leukocytosis.  Lactic acid was not checked.  Mildly increased creatinine and T. bili likely due to dehydration but did not meet the  criteria for AKI.  Patient has upper respiratory symptoms, and left-sided abdominal pain and CT abdomen concerning for left pyelonephritis.  Respiratory panel was negative for COVID, influenza and RSV.  Chest x-ray was negative.  RVP negative.  Preliminary blood cultures negative and urine cultures pending. -Continue with ceftriaxone  -Follow-up culture results - Supportive care for upper respiratory symptoms   Microcytic anemia Hemoglobin with further decreased to 8.8, no current obvious bleeding but patient just finished her cycle and she had history of significant menorrhagia.   Anemia panel with B12 and iron  deficiency -Ordered 1 dose of IV iron  followed by p.o. supplement -Ordered some IM B12 supplement followed by p.o. -Discussed with patient that she need to follow-up with her gynecologist  Hypokalemia Significant hypokalemia with potassium at 2.5 today.  No obvious GI losses.  Magnesium normal -Replace potassium and monitor  Hypophosphatemia Mild hypophosphatemia with phosphorus at 2.3 -Replace phosphorus and monitor   Subjective: Patient with some improving cough and congestion.  Left-sided abdominal pain also seems improving.  Denies any ongoing bleeding, no nausea or vomiting.  She just finished her heavy cycle.  Physical Exam: Vitals:   02/02/24 1956 02/03/24 0312 02/03/24 0458 02/03/24 0755  BP: (!) 110/97 129/83  101/61  Pulse: (!) 109 100  85  Resp: 18 17  16   Temp: 98.4 F (36.9 C) (!) 102.8 F (39.3 C) 98.6 F (37 C) 98.5 F (36.9 C)  TempSrc: Oral Oral Oral   SpO2: 100% 99%  100%  Weight:      Height:       General.  Obese lady, in no acute distress. Pulmonary.  Lungs  clear bilaterally, normal respiratory effort. CV.  Regular rate and rhythm, no JVD, rub or murmur. Abdomen.  Soft, nontender, nondistended, BS positive. CNS.  Alert and oriented .  No focal neurologic deficit. Extremities.  No edema, no cyanosis, pulses intact and symmetrical. Psychiatry.   Judgment and insight appears normal.   Data Reviewed: Prior data reviewed  Family Communication: Discussed with patient  Disposition: Status is: Inpatient Remains inpatient appropriate because: Severity of illness  Planned Discharge Destination: Home  DVT prophylaxis.  Lovenox  Time spent: 50 minutes  This record has been created using Conservation officer, historic buildings. Errors have been sought and corrected,but may not always be located. Such creation errors do not reflect on the standard of care.   Author: Amaryllis Dare, MD 02/03/2024 1:25 PM  For on call review [email or web address].

## 2024-02-04 LAB — RENAL FUNCTION PANEL
Albumin: 2.7 g/dL — ABNORMAL LOW (ref 3.5–5.0)
Anion gap: 13 (ref 5–15)
BUN: <5 mg/dL — ABNORMAL LOW (ref 6–20)
CO2: 23 mmol/L (ref 22–32)
Calcium: 8.3 mg/dL — ABNORMAL LOW (ref 8.9–10.3)
Chloride: 103 mmol/L (ref 98–111)
Creatinine, Ser: 0.75 mg/dL (ref 0.44–1.00)
GFR, Estimated: >60 mL/min (ref >60)
Glucose, Bld: 97 mg/dL (ref 70–99)
Phosphorus: 2.1 mg/dL — ABNORMAL LOW (ref 2.5–4.6)
Potassium: 3.6 mmol/L (ref 3.5–5.1)
Sodium: 139 mmol/L (ref 135–145)

## 2024-02-04 LAB — CBC
HCT: 27.8 % — ABNORMAL LOW (ref 36.0–46.0)
Hemoglobin: 9.1 g/dL — ABNORMAL LOW (ref 12.0–15.0)
MCH: 22 pg — ABNORMAL LOW (ref 26.0–34.0)
MCHC: 32.7 g/dL (ref 30.0–36.0)
MCV: 67.3 fL — ABNORMAL LOW (ref 80.0–100.0)
Platelets: 318 K/uL (ref 150–400)
RBC: 4.13 MIL/uL (ref 3.87–5.11)
RDW: 15.9 % — ABNORMAL HIGH (ref 11.5–15.5)
WBC: 9.8 K/uL (ref 4.0–10.5)
nRBC: 0 % (ref 0.0–0.2)

## 2024-02-04 LAB — URINE CULTURE: Culture: >=100000 — AB

## 2024-02-04 MED ORDER — CIPROFLOXACIN HCL 500 MG PO TABS: 500.0000 mg | ORAL_TABLET | Freq: Two times a day (BID) | ORAL | 0 refills | Status: AC

## 2024-02-04 MED ORDER — DM-GUAIFENESIN ER 30-600 MG PO TB12
1.0000 | ORAL_TABLET | Freq: Two times a day (BID) | ORAL | 0 refills | Status: AC | PRN
Start: 2024-02-04 — End: ?

## 2024-02-04 MED ORDER — CYANOCOBALAMIN 1000 MCG PO TABS: 1000.0000 ug | ORAL_TABLET | Freq: Every day | ORAL | 1 refills | Status: AC

## 2024-02-04 MED ORDER — K PHOS MONO-SOD PHOS DI & MONO 155-852-130 MG PO TABS
500.0000 mg | ORAL_TABLET | Freq: Once | ORAL | Status: AC
  Administered 2024-02-04: 500 mg via ORAL
  Filled 2024-02-04: qty 2

## 2024-02-04 MED ORDER — FERROUS SULFATE 325 (65 FE) MG PO TABS: 325.0000 mg | ORAL_TABLET | Freq: Two times a day (BID) | ORAL | 0 refills | Status: AC

## 2024-02-04 MED ORDER — LORATADINE 10 MG PO TABS: 10.0000 mg | ORAL_TABLET | Freq: Every day | ORAL | 1 refills | Status: AC

## 2024-02-04 NOTE — Discharge Summary (Signed)
 Physician Discharge Summary   Patient: Chelsey Mendoza MRN: [id number] DOB: 29-Aug-1985  Admit date:     02/02/2024  Discharge date: 02/04/24  Discharge Physician: Amaryllis Dare   PCP: Pcp, No   Recommendations at discharge:  Please obtain CBC and BMP on follow-up Follow-up with primary care provider  Discharge Diagnoses: Principal Problem:   Pyelonephritis Active Problems:   Acute sepsis (HCC)   Microcytic anemia   Hypokalemia   Hypophosphatemia   Hospital Course: Chelsey Mendoza is a 38 y.o. female with no significant medical history significant presented with generalized aches and pain, fever, decreased appetite and poor p.o. intake for the past 4 days.  Patient was also having cough and nasal congestion for the past couple of days.  She had some nausea and had 1 vomitus.  No diarrhea.  She was complaining of left-sided abdominal pain.  No dysuria, do endorse decreased urinary output and a strong smell.   On presentation patient was febrile at 100.6, tachycardic at 126, rest of the vital stable and on room air.  Labs with neutrophilic predominant leukocytosis at 16.7, hemoglobin of 10.8 which seems to be around her baseline, creatinine at 1.08, T. bili of 1.8, UA with microscopic hematuria, mild ketonuria, significant protein urea, small leukocytes and many bacteria.  Respiratory panel negative for COVID, RSV and influenza.  CXR negative for any acute abnormality.  CT abdomen and pelvis with concern of left pyelonephritis.   Patient was given 1 bolus of normal saline and ceftriaxone . She was admitted for pyelonephritis.  8/21: Afebrile with stable vitals, maximum temperature recorded of 102 over the past 24 hours.  Small improvement in leukocytosis to 15.8 but hemoglobin decreased to 8.8-also all cell lines decreased so likely some dilutional effect.  Significant hypokalemia with potassium at 2.5, creatinine and T. bili improved.  RVP negative. Anemia panel with iron  and B12  deficiency-started on supplement. Preliminary blood cultures negative and urine cultures pending.  8/22: Remained hemodynamically stable and afebrile.  Urine cultures with E. coli which showed resistance only to Augmentin  and ampicillin.  Patient received ceftriaxone  while in the hospital and is being discharged on ciprofloxacin  to complete the course.  Patient was also started on iron  and B12 supplement and instructed to follow-up with her gynecologist for concern of significant menorrhagia causing anemia.  She will continue on current medications and need to have a follow-up with her providers for further assistance.  Assessment and Plan: * Pyelonephritis Patient met sepsis criteria with fever, tachycardia and leukocytosis.  Lactic acid was not checked.  Mildly increased creatinine and T. bili likely due to dehydration but did not meet the criteria for AKI.  Patient has upper respiratory symptoms, and left-sided abdominal pain and CT abdomen concerning for left pyelonephritis.  Respiratory panel was negative for COVID, influenza and RSV.  Chest x-ray was negative.  RVP negative.  Preliminary blood cultures negative and urine cultures pending. Received ceftriaxone  while in the hospital and is being discharged on ciprofloxacin  based on urine culture results.  Microcytic anemia Hemoglobin with further decreased to 8.8, no current obvious bleeding but patient just finished her cycle and she had history of significant menorrhagia.   Anemia panel with B12 and iron  deficiency - Received 1 dose of IV iron  followed by p.o. supplement - Received some IM B12 supplement followed by p.o. -Discussed with patient that she need to follow-up with her gynecologist  Hypokalemia Resolved  Hypophosphatemia Mild hypophosphatemia with phosphorus at 2.3 -Replace phosphorus and monitor   Consultants: None  Procedures performed: None Disposition: Home Diet recommendation:  Discharge Diet Orders (From  admission, onward)     Start     Ordered   02/04/24 0000  Diet - low sodium heart healthy        02/04/24 1041           Regular diet DISCHARGE MEDICATION: Allergies as of 02/04/2024   No Known Allergies      Medication List     STOP taking these medications    amoxicillin  875 MG tablet Commonly known as: AMOXIL    butalbital -acetaminophen -caffeine  50-325-40 MG tablet Commonly known as: FIORICET   carbamazepine  100 MG 12 hr tablet Commonly known as: TEGretol -XR   ibuprofen 200 MG tablet Commonly known as: ADVIL   oxymetazoline  0.05 % nasal spray Commonly known as: AFRIN       TAKE these medications    albuterol  108 (90 Base) MCG/ACT inhaler Commonly known as: VENTOLIN  HFA Inhale 2 puffs into the lungs every 6 (six) hours as needed for wheezing or shortness of breath. What changed: Another medication with the same name was removed. Continue taking this medication, and follow the directions you see here.   ciprofloxacin  500 MG tablet Commonly known as: Cipro  Take 1 tablet (500 mg total) by mouth 2 (two) times daily for 3 days.   cyanocobalamin  1000 MCG tablet Take 1 tablet (1,000 mcg total) by mouth daily.   dextromethorphan -guaiFENesin  30-600 MG 12hr tablet Commonly known as: MUCINEX  DM Take 1 tablet by mouth 2 (two) times daily as needed for cough.   ferrous sulfate  325 (65 FE) MG tablet Take 1 tablet (325 mg total) by mouth 2 (two) times daily with a meal.   loratadine  10 MG tablet Commonly known as: CLARITIN  Take 1 tablet (10 mg total) by mouth daily.        Discharge Exam: Filed Weights   02/02/24 1320  Weight: 96.6 kg   General.  Obese lady, in no acute distress. Pulmonary.  Lungs clear bilaterally, normal respiratory effort. CV.  Regular rate and rhythm, no JVD, rub or murmur. Abdomen.  Soft, nontender, nondistended, BS positive. CNS.  Alert and oriented .  No focal neurologic deficit. Extremities.  No edema, no cyanosis, pulses  intact and symmetrical. Psychiatry.  Judgment and insight appears normal.   Condition at discharge: stable  The results of significant diagnostics from this hospitalization (including imaging, microbiology, ancillary and laboratory) are listed below for reference.   Imaging Studies: CT ABDOMEN PELVIS W CONTRAST Result Date: 02/02/2024 CLINICAL DATA:  Cough, chills and body aches.  Fever. EXAM: CT ABDOMEN AND PELVIS WITH CONTRAST TECHNIQUE: Multidetector CT imaging of the abdomen and pelvis was performed using the standard protocol following bolus administration of intravenous contrast. RADIATION DOSE REDUCTION: This exam was performed according to the departmental dose-optimization program which includes automated exposure control, adjustment of the mA and/or kV according to patient size and/or use of iterative reconstruction technique. CONTRAST:  OMNIPAQUE  IOHEXOL  300 MG/ML  SOLN COMPARISON:  None Available. FINDINGS: Lower chest: No acute findings. Heart size normal. No pericardial effusion. No pleural effusion. Distal esophagus Hepatobiliary: Small hepatic cysts. No specific follow-up necessary. Liver and gallbladder are otherwise unremarkable. No biliary ductal dilatation. Pancreas: Negative. Spleen: Negative. Adrenals/Urinary Tract: Adrenal glands and right kidney are unremarkable. Left renal edema with subtle areas of ill-defined heterogeneity in the left kidney. Associated hyperattenuation of the proximal left ureteral wall. No urinary stones. Ureters are decompressed. Bladder is grossly unremarkable. Stomach/Bowel: Stomach, small bowel, appendix and colon  are unremarkable. Vascular/Lymphatic: Vascular structures are unremarkable. Scattered lymph nodes are not enlarged by CT size criteria. Reproductive: Uterus is visualized. No adnexal mass. Small vaginal cyst. Other: No free fluid.  Mesenteries and peritoneum are unremarkable. Musculoskeletal: Minimal degenerative change in the spine.  IMPRESSION: Left pyelonephritis. Electronically Signed   By: Newell Eke M.D.   On: 02/02/2024 17:14   DG Chest 2 View Result Date: 02/02/2024 CLINICAL DATA:  Cough and fever EXAM: CHEST - 2 VIEW COMPARISON:  Chest x-ray 03/18/2023 FINDINGS: The heart size and mediastinal contours are within normal limits. Both lungs are clear. The visualized skeletal structures are unremarkable. IMPRESSION: No active cardiopulmonary disease. Electronically Signed   By: Greig Pique M.D.   On: 02/02/2024 15:34    Microbiology: Results for orders placed or performed during the hospital encounter of 02/02/24  Resp panel by RT-PCR (RSV, Flu A&B, Covid) Anterior Nasal Swab     Status: None   Collection Time: 02/02/24  1:22 PM   Specimen: Anterior Nasal Swab  Result Value Ref Range Status   SARS Coronavirus 2 by RT PCR NEGATIVE NEGATIVE Final    Comment: (NOTE) SARS-CoV-2 target nucleic acids are NOT DETECTED.  The SARS-CoV-2 RNA is generally detectable in upper respiratory specimens during the acute phase of infection. The lowest concentration of SARS-CoV-2 viral copies this assay can detect is 138 copies/mL. A negative result does not preclude SARS-Cov-2 infection and should not be used as the sole basis for treatment or other patient management decisions. A negative result may occur with  improper specimen collection/handling, submission of specimen other than nasopharyngeal swab, presence of viral mutation(s) within the areas targeted by this assay, and inadequate number of viral copies(<138 copies/mL). A negative result must be combined with clinical observations, patient history, and epidemiological information. The expected result is Negative.  Fact Sheet for Patients:  BloggerCourse.com  Fact Sheet for Healthcare Providers:  SeriousBroker.it  This test is no t yet approved or cleared by the [place]  FDA and  has been authorized for  detection and/or diagnosis of SARS-CoV-2 by FDA under an Emergency Use Authorization (EUA). This EUA will remain  in effect (meaning this test can be used) for the duration of the COVID-19 declaration under Section 564(b)(1) of the Act, 21 U.S.C.section 360bbb-3(b)(1), unless the authorization is terminated  or revoked sooner.       Influenza A by PCR NEGATIVE NEGATIVE Final   Influenza B by PCR NEGATIVE NEGATIVE Final    Comment: (NOTE) The Xpert Xpress SARS-CoV-2/FLU/RSV plus assay is intended as an aid in the diagnosis of influenza from Nasopharyngeal swab specimens and should not be used as a sole basis for treatment. Nasal washings and aspirates are unacceptable for Xpert Xpress SARS-CoV-2/FLU/RSV testing.  Fact Sheet for Patients: BloggerCourse.com  Fact Sheet for Healthcare Providers: SeriousBroker.it  This test is not yet approved or cleared by the [place]  FDA and has been authorized for detection and/or diagnosis of SARS-CoV-2 by FDA under an Emergency Use Authorization (EUA). This EUA will remain in effect (meaning this test can be used) for the duration of the COVID-19 declaration under Section 564(b)(1) of the Act, 21 U.S.C. section 360bbb-3(b)(1), unless the authorization is terminated or revoked.     Resp Syncytial Virus by PCR NEGATIVE NEGATIVE Final    Comment: (NOTE) Fact Sheet for Patients: BloggerCourse.com  Fact Sheet for Healthcare Providers: SeriousBroker.it  This test is not yet approved or cleared by the [place]  FDA and has been authorized for detection  and/or diagnosis of SARS-CoV-2 by FDA under an Emergency Use Authorization (EUA). This EUA will remain in effect (meaning this test can be used) for the duration of the COVID-19 declaration under Section 564(b)(1) of the Act, 21 U.S.C. section 360bbb-3(b)(1), unless the authorization is  terminated or revoked.  Performed at [facility], [address], [place], [place] [address]   Urine Culture (for pregnant, neutropenic or urologic patients or patients with an indwelling urinary catheter)     Status: Abnormal   Collection Time: 02/02/24  3:08 PM   Specimen: Urine, Clean Catch  Result Value Ref Range Status   Specimen Description   Final    URINE, CLEAN CATCH Performed at [facility], [address], [place], [place] [address]    Special Requests   Final    NONE Performed at [facility], [address] [place], [place] [address]    Culture >=100,000 COLONIES/mL ESCHERICHIA COLI (A)  Final   Report Status 02/04/2024 FINAL  Final   Organism ID, Bacteria ESCHERICHIA COLI (A)  Final      Susceptibility   Escherichia coli - MIC*    AMPICILLIN >=32 RESISTANT Resistant     CEFAZOLIN (URINE) Value in next row Sensitive      8 SENSITIVEThis is a modified FDA-approved test that has been validated and its performance characteristics determined by the reporting laboratory.  This laboratory is certified under the Clinical Laboratory Improvement Amendments CLIA as qualified to perform high complexity clinical laboratory testing.    CEFEPIME Value in next row Sensitive      8 SENSITIVEThis is a modified FDA-approved test that has been validated and its performance characteristics determined by the reporting laboratory.  This laboratory is certified under the Clinical Laboratory Improvement Amendments CLIA as qualified to perform high complexity clinical laboratory testing.    ERTAPENEM Value in next row Sensitive      8 SENSITIVEThis is a modified FDA-approved test that has been validated and its performance characteristics determined by the reporting laboratory.  This laboratory is certified under the Clinical Laboratory Improvement Amendments CLIA as qualified to perform high complexity clinical laboratory testing.    CEFTRIAXONE  Value in next row  Sensitive      8 SENSITIVEThis is a modified FDA-approved test that has been validated and its performance characteristics determined by the reporting laboratory.  This laboratory is certified under the Clinical Laboratory Improvement Amendments CLIA as qualified to perform high complexity clinical laboratory testing.    CIPROFLOXACIN  Value in next row Sensitive      8 SENSITIVEThis is a modified FDA-approved test that has been validated and its performance characteristics determined by the reporting laboratory.  This laboratory is certified under the Clinical Laboratory Improvement Amendments CLIA as qualified to perform high complexity clinical laboratory testing.    GENTAMICIN Value in next row Sensitive      8 SENSITIVEThis is a modified FDA-approved test that has been validated and its performance characteristics determined by the reporting laboratory.  This laboratory is certified under the Clinical Laboratory Improvement Amendments CLIA as qualified to perform high complexity clinical laboratory testing.    NITROFURANTOIN Value in next row Sensitive      8 SENSITIVEThis is a modified FDA-approved test that has been validated and its performance characteristics determined by the reporting laboratory.  This laboratory is certified under the Clinical Laboratory Improvement Amendments CLIA as qualified to perform high complexity clinical laboratory testing.    TRIMETH /SULFA  Value in next row Sensitive  8 SENSITIVEThis is a modified FDA-approved test that has been validated and its performance characteristics determined by the reporting laboratory.  This laboratory is certified under the Clinical Laboratory Improvement Amendments CLIA as qualified to perform high complexity clinical laboratory testing.    AMPICILLIN/SULBACTAM Value in next row Resistant      8 SENSITIVEThis is a modified FDA-approved test that has been validated and its performance characteristics determined by the reporting  laboratory.  This laboratory is certified under the Clinical Laboratory Improvement Amendments CLIA as qualified to perform high complexity clinical laboratory testing.    PIP/TAZO Value in next row Sensitive ug/mL     <=4 SENSITIVEThis is a modified FDA-approved test that has been validated and its performance characteristics determined by the reporting laboratory.  This laboratory is certified under the Clinical Laboratory Improvement Amendments CLIA as qualified to perform high complexity clinical laboratory testing.    MEROPENEM Value in next row Sensitive      <=4 SENSITIVEThis is a modified FDA-approved test that has been validated and its performance characteristics determined by the reporting laboratory.  This laboratory is certified under the Clinical Laboratory Improvement Amendments CLIA as qualified to perform high complexity clinical laboratory testing.    * >=100,000 COLONIES/mL ESCHERICHIA COLI  Culture, blood (Routine X 2) w Reflex to ID Panel     Status: None (Preliminary result)   Collection Time: 02/02/24  6:09 PM   Specimen: BLOOD  Result Value Ref Range Status   Specimen Description BLOOD RIGHT ANTECUBITAL  Final   Special Requests   Final    BOTTLES DRAWN AEROBIC AND ANAEROBIC Blood Culture adequate volume   Culture   Final    NO GROWTH 2 DAYS Performed at [facility], [address] [place], [place] [address]    Report Status PENDING  Incomplete  Respiratory (~20 pathogens) panel by PCR     Status: None   Collection Time: 02/02/24  6:09 PM   Specimen: Nasopharyngeal Swab; Respiratory  Result Value Ref Range Status   Adenovirus NOT DETECTED NOT DETECTED Final   Coronavirus 229E NOT DETECTED NOT DETECTED Final    Comment: (NOTE) The Coronavirus on the Respiratory Panel, DOES NOT test for the novel  Coronavirus (2019 nCoV)    Coronavirus HKU1 NOT DETECTED NOT DETECTED Final   Coronavirus NL63 NOT DETECTED NOT DETECTED Final   Coronavirus OC43 NOT DETECTED  NOT DETECTED Final   Metapneumovirus NOT DETECTED NOT DETECTED Final   Rhinovirus / Enterovirus NOT DETECTED NOT DETECTED Final   Influenza A NOT DETECTED NOT DETECTED Final   Influenza B NOT DETECTED NOT DETECTED Final   Parainfluenza Virus 1 NOT DETECTED NOT DETECTED Final   Parainfluenza Virus 2 NOT DETECTED NOT DETECTED Final   Parainfluenza Virus 3 NOT DETECTED NOT DETECTED Final   Parainfluenza Virus 4 NOT DETECTED NOT DETECTED Final   Respiratory Syncytial Virus NOT DETECTED NOT DETECTED Final   Bordetella pertussis NOT DETECTED NOT DETECTED Final   Bordetella Parapertussis NOT DETECTED NOT DETECTED Final   Chlamydophila pneumoniae NOT DETECTED NOT DETECTED Final   Mycoplasma pneumoniae NOT DETECTED NOT DETECTED Final    Comment: Performed at [facility], [address], [place], [place] [address]  Culture, blood (Routine X 2) w Reflex to ID Panel     Status: None (Preliminary result)   Collection Time: 02/02/24  7:30 PM   Specimen: BLOOD  Result Value Ref Range Status   Specimen Description BLOOD BLOOD RIGHT WRIST  Final  Special Requests   Final    BOTTLES DRAWN AEROBIC ONLY Blood Culture adequate volume   Culture   Final    NO GROWTH 2 DAYS Performed at [facility], [address] [place], [place] [address]    Report Status PENDING  Incomplete    Labs: CBC: Recent Labs  Lab 02/02/24 1540 02/03/24 0511 02/04/24 0432  WBC 16.7* 15.8* 9.8  NEUTROABS 11.9*  --   --   HGB 10.8* 8.8* 9.1*  HCT 33.4* 27.7* 27.8*  MCV 67.3* 68.4* 67.3*  PLT 356 312 318   Basic Metabolic Panel: Recent Labs  Lab 02/02/24 1540 02/03/24 0511 02/03/24 1427 02/04/24 0432  NA 135 135  --  139  K 3.9 2.5* 3.5 3.6  CL 98 99  --  103  CO2 28 23  --  23  GLUCOSE 116* 101*  --  97  BUN 9 6  --  <5*  CREATININE 1.08* 0.76  --  0.75  CALCIUM 8.5* 7.9*  --  8.3*  MG  --  2.1  --   --   PHOS  --  2.3*  --  2.1*   Liver Function Tests: Recent Labs  Lab  02/02/24 1540 02/03/24 0511 02/04/24 0432  AST 36 19  --   ALT 12 12  --   ALKPHOS 54 51  --   BILITOT 1.8* 0.5  --   PROT 8.3* 6.9  --   ALBUMIN 3.5 2.7* 2.7*   CBG: No results for input(s): GLUCAP in the last 168 hours.  Discharge time spent: greater than 30 minutes.  This record has been created using Conservation officer, historic buildings. Errors have been sought and corrected,but may not always be located. Such creation errors do not reflect on the standard of care.   Signed: Amaryllis Dare, MD [facility] 02/04/2024

## 2024-02-07 LAB — CULTURE, BLOOD (ROUTINE X 2)
Culture: NO GROWTH
Culture: NO GROWTH
Special Requests: ADEQUATE
Special Requests: ADEQUATE
